# Patient Record
Sex: Male | Born: 1970 | Race: Black or African American | Hispanic: No | Marital: Married | Smoking: Current every day smoker
Health system: Southern US, Community
[De-identification: ages and names within clinical notes are randomized; demographics above are authoritative.]

## PROBLEM LIST (undated history)

## (undated) DIAGNOSIS — J45909 Unspecified asthma, uncomplicated: Secondary | ICD-10-CM

## (undated) DIAGNOSIS — I10 Essential (primary) hypertension: Secondary | ICD-10-CM

## (undated) HISTORY — PX: ACHILLES TENDON REPAIR: SUR1153

---

## 1997-10-19 ENCOUNTER — Emergency Department (HOSPITAL_COMMUNITY): Admission: EM | Admit: 1997-10-19 | Discharge: 1997-10-19 | Payer: Self-pay | Admitting: Emergency Medicine

## 1998-02-26 ENCOUNTER — Emergency Department (HOSPITAL_COMMUNITY): Admission: EM | Admit: 1998-02-26 | Discharge: 1998-02-26 | Payer: Self-pay | Admitting: *Deleted

## 1998-05-09 ENCOUNTER — Emergency Department (HOSPITAL_COMMUNITY): Admission: EM | Admit: 1998-05-09 | Discharge: 1998-05-09 | Payer: Self-pay

## 1998-05-09 ENCOUNTER — Encounter: Payer: Self-pay | Admitting: Emergency Medicine

## 1998-06-06 ENCOUNTER — Encounter: Payer: Self-pay | Admitting: Emergency Medicine

## 1998-06-06 ENCOUNTER — Emergency Department (HOSPITAL_COMMUNITY): Admission: EM | Admit: 1998-06-06 | Discharge: 1998-06-06 | Payer: Self-pay | Admitting: Emergency Medicine

## 1999-03-17 ENCOUNTER — Emergency Department (HOSPITAL_COMMUNITY): Admission: EM | Admit: 1999-03-17 | Discharge: 1999-03-17 | Payer: Self-pay | Admitting: Emergency Medicine

## 1999-03-23 ENCOUNTER — Emergency Department (HOSPITAL_COMMUNITY): Admission: EM | Admit: 1999-03-23 | Discharge: 1999-03-23 | Payer: Self-pay | Admitting: Emergency Medicine

## 1999-08-31 ENCOUNTER — Encounter: Payer: Self-pay | Admitting: Emergency Medicine

## 1999-08-31 ENCOUNTER — Emergency Department (HOSPITAL_COMMUNITY): Admission: EM | Admit: 1999-08-31 | Discharge: 1999-08-31 | Payer: Self-pay | Admitting: Emergency Medicine

## 1999-09-05 ENCOUNTER — Encounter: Payer: Self-pay | Admitting: Emergency Medicine

## 1999-09-05 ENCOUNTER — Emergency Department (HOSPITAL_COMMUNITY): Admission: EM | Admit: 1999-09-05 | Discharge: 1999-09-05 | Payer: Self-pay | Admitting: Emergency Medicine

## 1999-09-25 ENCOUNTER — Encounter: Payer: Self-pay | Admitting: Emergency Medicine

## 1999-09-25 ENCOUNTER — Emergency Department (HOSPITAL_COMMUNITY): Admission: EM | Admit: 1999-09-25 | Discharge: 1999-09-25 | Payer: Self-pay | Admitting: Emergency Medicine

## 2000-03-08 ENCOUNTER — Emergency Department (HOSPITAL_COMMUNITY): Admission: EM | Admit: 2000-03-08 | Discharge: 2000-03-08 | Payer: Self-pay | Admitting: Emergency Medicine

## 2000-04-17 ENCOUNTER — Emergency Department (HOSPITAL_COMMUNITY): Admission: EM | Admit: 2000-04-17 | Discharge: 2000-04-17 | Payer: Self-pay | Admitting: Internal Medicine

## 2000-07-05 ENCOUNTER — Emergency Department (HOSPITAL_COMMUNITY): Admission: EM | Admit: 2000-07-05 | Discharge: 2000-07-05 | Payer: Self-pay | Admitting: Emergency Medicine

## 2000-11-20 ENCOUNTER — Emergency Department (HOSPITAL_COMMUNITY): Admission: EM | Admit: 2000-11-20 | Discharge: 2000-11-21 | Payer: Self-pay | Admitting: Emergency Medicine

## 2000-11-21 ENCOUNTER — Encounter: Payer: Self-pay | Admitting: Emergency Medicine

## 2000-12-22 ENCOUNTER — Emergency Department (HOSPITAL_COMMUNITY): Admission: EM | Admit: 2000-12-22 | Discharge: 2000-12-22 | Payer: Self-pay | Admitting: *Deleted

## 2000-12-22 ENCOUNTER — Encounter: Payer: Self-pay | Admitting: Emergency Medicine

## 2000-12-22 ENCOUNTER — Emergency Department (HOSPITAL_COMMUNITY): Admission: EM | Admit: 2000-12-22 | Discharge: 2000-12-22 | Payer: Self-pay | Admitting: Emergency Medicine

## 2001-02-09 ENCOUNTER — Encounter: Payer: Self-pay | Admitting: Emergency Medicine

## 2001-02-09 ENCOUNTER — Emergency Department (HOSPITAL_COMMUNITY): Admission: EM | Admit: 2001-02-09 | Discharge: 2001-02-09 | Payer: Self-pay | Admitting: Emergency Medicine

## 2001-02-11 ENCOUNTER — Emergency Department (HOSPITAL_COMMUNITY): Admission: EM | Admit: 2001-02-11 | Discharge: 2001-02-11 | Payer: Self-pay | Admitting: Emergency Medicine

## 2001-05-18 ENCOUNTER — Emergency Department (HOSPITAL_COMMUNITY): Admission: EM | Admit: 2001-05-18 | Discharge: 2001-05-18 | Payer: Self-pay

## 2001-12-15 ENCOUNTER — Emergency Department (HOSPITAL_COMMUNITY): Admission: EM | Admit: 2001-12-15 | Discharge: 2001-12-15 | Payer: Self-pay | Admitting: *Deleted

## 2001-12-15 ENCOUNTER — Encounter: Payer: Self-pay | Admitting: Ophthalmology

## 2003-07-16 ENCOUNTER — Emergency Department (HOSPITAL_COMMUNITY): Admission: EM | Admit: 2003-07-16 | Discharge: 2003-07-16 | Payer: Self-pay | Admitting: Emergency Medicine

## 2003-09-18 ENCOUNTER — Emergency Department (HOSPITAL_COMMUNITY): Admission: EM | Admit: 2003-09-18 | Discharge: 2003-09-19 | Payer: Self-pay | Admitting: Emergency Medicine

## 2003-09-21 ENCOUNTER — Encounter (HOSPITAL_COMMUNITY): Admission: RE | Admit: 2003-09-21 | Discharge: 2003-12-20 | Payer: Self-pay | Admitting: Emergency Medicine

## 2003-10-11 ENCOUNTER — Emergency Department (HOSPITAL_COMMUNITY): Admission: EM | Admit: 2003-10-11 | Discharge: 2003-10-11 | Payer: Self-pay | Admitting: Emergency Medicine

## 2003-12-05 ENCOUNTER — Encounter (INDEPENDENT_AMBULATORY_CARE_PROVIDER_SITE_OTHER): Payer: Self-pay | Admitting: *Deleted

## 2003-12-05 ENCOUNTER — Ambulatory Visit (HOSPITAL_BASED_OUTPATIENT_CLINIC_OR_DEPARTMENT_OTHER): Admission: RE | Admit: 2003-12-05 | Discharge: 2003-12-05 | Payer: Self-pay | Admitting: Otolaryngology

## 2003-12-05 ENCOUNTER — Ambulatory Visit (HOSPITAL_COMMUNITY): Admission: RE | Admit: 2003-12-05 | Discharge: 2003-12-05 | Payer: Self-pay | Admitting: Otolaryngology

## 2003-12-23 ENCOUNTER — Emergency Department (HOSPITAL_COMMUNITY): Admission: EM | Admit: 2003-12-23 | Discharge: 2003-12-23 | Payer: Self-pay | Admitting: Emergency Medicine

## 2004-01-02 ENCOUNTER — Emergency Department (HOSPITAL_COMMUNITY): Admission: EM | Admit: 2004-01-02 | Discharge: 2004-01-02 | Payer: Self-pay | Admitting: Emergency Medicine

## 2005-01-05 ENCOUNTER — Emergency Department (HOSPITAL_COMMUNITY): Admission: EM | Admit: 2005-01-05 | Discharge: 2005-01-06 | Payer: Self-pay | Admitting: Emergency Medicine

## 2006-12-29 ENCOUNTER — Emergency Department (HOSPITAL_COMMUNITY): Admission: EM | Admit: 2006-12-29 | Discharge: 2006-12-29 | Payer: Self-pay | Admitting: Emergency Medicine

## 2007-01-21 ENCOUNTER — Emergency Department (HOSPITAL_COMMUNITY): Admission: EM | Admit: 2007-01-21 | Discharge: 2007-01-21 | Payer: Self-pay | Admitting: Emergency Medicine

## 2007-03-12 ENCOUNTER — Emergency Department (HOSPITAL_COMMUNITY): Admission: EM | Admit: 2007-03-12 | Discharge: 2007-03-12 | Payer: Self-pay | Admitting: Emergency Medicine

## 2007-08-02 ENCOUNTER — Emergency Department (HOSPITAL_COMMUNITY): Admission: EM | Admit: 2007-08-02 | Discharge: 2007-08-02 | Payer: Self-pay | Admitting: Emergency Medicine

## 2007-09-25 ENCOUNTER — Emergency Department (HOSPITAL_COMMUNITY): Admission: EM | Admit: 2007-09-25 | Discharge: 2007-09-25 | Payer: Self-pay | Admitting: Emergency Medicine

## 2008-04-15 ENCOUNTER — Emergency Department (HOSPITAL_COMMUNITY): Admission: EM | Admit: 2008-04-15 | Discharge: 2008-04-16 | Payer: Self-pay | Admitting: Emergency Medicine

## 2008-07-16 ENCOUNTER — Emergency Department (HOSPITAL_COMMUNITY): Admission: EM | Admit: 2008-07-16 | Discharge: 2008-07-16 | Payer: Self-pay | Admitting: Emergency Medicine

## 2008-09-04 ENCOUNTER — Emergency Department (HOSPITAL_COMMUNITY): Admission: EM | Admit: 2008-09-04 | Discharge: 2008-09-04 | Payer: Self-pay | Admitting: Emergency Medicine

## 2008-11-02 ENCOUNTER — Emergency Department (HOSPITAL_COMMUNITY): Admission: EM | Admit: 2008-11-02 | Discharge: 2008-11-02 | Payer: Self-pay | Admitting: Emergency Medicine

## 2008-12-08 ENCOUNTER — Emergency Department (HOSPITAL_COMMUNITY): Admission: EM | Admit: 2008-12-08 | Discharge: 2008-12-08 | Payer: Self-pay | Admitting: Emergency Medicine

## 2009-01-24 ENCOUNTER — Emergency Department (HOSPITAL_COMMUNITY): Admission: EM | Admit: 2009-01-24 | Discharge: 2009-01-24 | Payer: Self-pay | Admitting: Emergency Medicine

## 2009-05-09 ENCOUNTER — Emergency Department (HOSPITAL_COMMUNITY): Admission: EM | Admit: 2009-05-09 | Discharge: 2009-05-09 | Payer: Self-pay | Admitting: Emergency Medicine

## 2009-09-22 ENCOUNTER — Emergency Department (HOSPITAL_COMMUNITY): Admission: EM | Admit: 2009-09-22 | Discharge: 2009-09-22 | Payer: Self-pay | Admitting: Emergency Medicine

## 2010-04-20 ENCOUNTER — Emergency Department (HOSPITAL_COMMUNITY)
Admission: EM | Admit: 2010-04-20 | Discharge: 2010-04-21 | Payer: Self-pay | Source: Home / Self Care | Admitting: Emergency Medicine

## 2010-08-18 LAB — COMPREHENSIVE METABOLIC PANEL
ALT: 21 U/L (ref 0–53)
Alkaline Phosphatase: 52 U/L (ref 39–117)
CO2: 22 mEq/L (ref 19–32)
GFR calc non Af Amer: 52 mL/min — ABNORMAL LOW (ref >60)
Glucose, Bld: 128 mg/dL — ABNORMAL HIGH (ref 70–99)
Potassium: 4.1 mEq/L (ref 3.5–5.1)
Sodium: 131 mEq/L — ABNORMAL LOW (ref 135–145)

## 2010-08-18 LAB — URINALYSIS, ROUTINE W REFLEX MICROSCOPIC
Glucose, UA: NEGATIVE mg/dL
Hgb urine dipstick: NEGATIVE
Protein, ur: NEGATIVE mg/dL
Specific Gravity, Urine: 1.003 — ABNORMAL LOW (ref 1.005–1.030)

## 2010-08-18 LAB — GLUCOSE, CAPILLARY: Glucose-Capillary: 131 mg/dL — ABNORMAL HIGH (ref 70–99)

## 2010-08-18 LAB — DIFFERENTIAL
Basophils Relative: 0 % (ref 0–1)
Eosinophils Absolute: 0 10*3/uL (ref 0.0–0.7)
Neutrophils Relative %: 90 % — ABNORMAL HIGH (ref 43–77)

## 2010-08-18 LAB — CBC
Hemoglobin: 14.9 g/dL (ref 13.0–17.0)
RBC: 5.03 MIL/uL (ref 4.22–5.81)

## 2010-09-27 NOTE — Consult Note (Signed)
Angel Martin, [facility]                             ACCOUNT NO.:  [id number]   MEDICAL RECORD NO.:  [id number]                   PATIENT TYPE:  EMS   LOCATION:  ED                                   FACILITY:  [facility]   PHYSICIAN:  Blake Divine., M.D.              DATE OF BIRTH:  July 20, 1970   DATE OF CONSULTATION:  12/15/2001  DATE OF DISCHARGE:  12/15/2001                            OPHTHALMOLOGY CONSULTATION   REASON FOR CONSULTATION:  I was asked to see this patient by Dr. Corlis Leak.  The patient is a 40 year old male who complains of pain and redness  of the left eye; onset approximately two weeks ago.  The patient states he  was working in an area with a lot of dust, and irritation seemed to start  immediately following that, and has gotten progressively worse.  The patient  has tried taking ibuprofen and Motrin, which seems to decrease the pain.  He  has also used Clear-Eye but without effect on the retinas.  The patient  presents today because his vision started to get worse about one week ago,  and has gotten worse and worse to the left eye.  On examination his visual  acuity without correction is 20/40 right eye; 20/200 left eye.  The patient  did not bring his eye glasses.  The patient denies any contact lens wear.  Denies any other trauma except as noted.  The patient's pupils are 3 mm and  reactive.  His confrontation field is full right eye, and is not able to  test left eye secondary to photophobia and pain.  Motility is full.  The  patient's intraocular pressure was 16 right eye, 19 left eye.  The slit lamp  examination reveals plus 2-3 conjunctival injection left eye, conjunctival  quiet right eye, cornea clear right eye; with trace endothelial pigment  right eye.  The cornea left eye revealed endothelial pigment deposit.  There  is a layered 2% white hyphema of the left eye, with plus-3 cell and flare in  the anterior chamber.  The iris reveals posterior synechiae  left eye.  There  are signs of a baseous ring right eye, with pigment on the anterior capsule  of the lens.  The lens is a plus-one nuclear sclerosis.  The fundus revealed  a positive red reflex.  At the time of this dictation his eyes have been  dilated.   IMPRESSION:  1. 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  2. Eye pain.  3. Decreased vision.  All probably secondary to possible collagen vascular disease, which is  sarcoidosis versus Behcet's versus other causes of associated iritis (such  as rheumatoid arthritis).  We will also have to rule out  Louie's.   PLAN:  The patient is being treated in the emergency department with topical  home atropine, Cyclogyl, phenylephrine and TobraDex eye drops.  The patient  will be placed  on TobraDex eye drops to the left eye q.i.d. , and Cyclogyl  q.i.d.  We will also obtain a chest x-ray and a lumbar spine.  Labs will  include ACE, A&A, CBC with differential and platelets, electrolyte panel,  RPR and C-reactive protein and rheumatoid factor.  The patient will follow  up with me tomorrow.                                                Blake Divine., M.D.    RW/MEDQ  D:  12/15/2001  T:  12/20/2001  Job:  [id number]   cc:   [facility]  EMail:  [email or web address]

## 2010-09-27 NOTE — Op Note (Signed)
NAME:  Cancelliere', [facility]                            ACCOUNT NO.:  [id number]   MEDICAL RECORD NO.:  [id number]                   PATIENT TYPE:  AMB   LOCATION:  DSC                                  FACILITY:  [facility]   PHYSICIAN:  Christopher E. Ezzard Standing, M.D.         DATE OF BIRTH:  [birth year]   DATE OF PROCEDURE:  12/05/2003  DATE OF DISCHARGE:                                 OPERATIVE REPORT   PREOPERATIVE DIAGNOSIS:  Left forehead cyst.   POSTOPERATIVE DIAGNOSIS:  Left forehead cyst.   OPERATION PERFORMED:  Excision of left forehead cyst (2 cm).   SURGEON:  Kristine Garbe. Ezzard Standing, M.D.   ANESTHESIA:  Local 1% Xylocaine with 1:100,000 epinephrine.   COMPLICATIONS:  None.   INDICATIONS FOR PROCEDURE:  Angel Martin is a 40 year old gentleman who has had a  cyst above his left eyebrow which had been previously incised and drained  and there is still a firm nodule with a deep scar where the previous cyst  had been incised and drained.  He is taken to the operating room at this  time for wide local excision of remaining cyst and scar tissue.   DESCRIPTION OF PROCEDURE:  The area was prepped with Betadine solution and  was marked out with a Marking pen and injected with Xylocaine with  epinephrine for local anesthetic.  The deep scar and the bulging cyst was  excised down to the subcutaneous muscle layer.  Hemostasis was obtained with  the eye cautery.  The defect was then closed with 4-0 Vicryl sutures  subcutaneously and a 5-0 subcuticular nylon suture along with Steri-Strips.  Leeum tolerated this well.  Will have him follow up in my office in three  days to have the subcuticular stitch removed.  He was instructed to take  Tylenol as needed for pain.                                               Kristine Garbe. Ezzard Standing, M.D.    CEN/MEDQ  D:  12/05/2003  T:  12/05/2003  Job:  [id number]

## 2010-11-16 ENCOUNTER — Emergency Department (HOSPITAL_COMMUNITY)
Admission: EM | Admit: 2010-11-16 | Discharge: 2010-11-16 | Disposition: A | Discharge status: Home / Self Care | Payer: Self-pay | Attending: Emergency Medicine | Admitting: Emergency Medicine

## 2010-11-16 DIAGNOSIS — R0609 Other forms of dyspnea: Secondary | ICD-10-CM | POA: Insufficient documentation

## 2010-11-16 DIAGNOSIS — J45909 Unspecified asthma, uncomplicated: Secondary | ICD-10-CM | POA: Insufficient documentation

## 2010-11-16 DIAGNOSIS — R0989 Other specified symptoms and signs involving the circulatory and respiratory systems: Secondary | ICD-10-CM | POA: Insufficient documentation

## 2010-11-16 DIAGNOSIS — R0602 Shortness of breath: Secondary | ICD-10-CM | POA: Insufficient documentation

## 2011-02-11 ENCOUNTER — Emergency Department (HOSPITAL_COMMUNITY)
Admission: EM | Admit: 2011-02-11 | Discharge: 2011-02-11 | Disposition: A | Discharge status: Home / Self Care | Payer: Self-pay | Attending: Emergency Medicine | Admitting: Emergency Medicine

## 2011-02-11 ENCOUNTER — Emergency Department (HOSPITAL_COMMUNITY): Payer: Self-pay

## 2011-02-11 DIAGNOSIS — M7989 Other specified soft tissue disorders: Secondary | ICD-10-CM | POA: Insufficient documentation

## 2011-02-11 DIAGNOSIS — M79609 Pain in unspecified limb: Secondary | ICD-10-CM | POA: Insufficient documentation

## 2011-09-16 ENCOUNTER — Emergency Department (HOSPITAL_COMMUNITY)
Admission: EM | Admit: 2011-09-16 | Discharge: 2011-09-16 | Disposition: A | Discharge status: Home / Self Care | Payer: Self-pay | Attending: Emergency Medicine | Admitting: Emergency Medicine

## 2011-09-16 ENCOUNTER — Emergency Department (HOSPITAL_COMMUNITY): Payer: Self-pay

## 2011-09-16 ENCOUNTER — Encounter (HOSPITAL_COMMUNITY): Payer: Self-pay | Admitting: *Deleted

## 2011-09-16 DIAGNOSIS — M25579 Pain in unspecified ankle and joints of unspecified foot: Secondary | ICD-10-CM | POA: Insufficient documentation

## 2011-09-16 MED ORDER — NAPROXEN 500 MG PO TABS: 500.0000 mg | ORAL_TABLET | Freq: Two times a day (BID) | ORAL | Status: DC

## 2011-09-16 NOTE — ED Provider Notes (Signed)
History     CSN: [id number]  Arrival date & time 09/16/11  5784   First MD Initiated Contact with Patient 09/16/11 [phone]      Chief Complaint  Patient presents with  . Ankle Pain    (Consider location/radiation/quality/duration/timing/severity/associated sxs/prior treatment) Patient is a 41 y.o. male presenting with ankle pain. The history is provided by the patient.  Ankle Pain  The incident occurred more than 2 days ago. The incident occurred at work. There was no injury mechanism. The pain is present in the right ankle. The pain is moderate. The pain has been constant since onset. Pertinent negatives include no numbness, no inability to bear weight, no muscle weakness, no loss of sensation and no tingling. The symptoms are aggravated by activity. He has tried NSAIDs for the symptoms. The treatment provided no relief.  The pain increases with walking.  Pt does not remember an injury.  NO fevers, cough, trouble breathing.  No calf pain.  History reviewed. No pertinent past medical history.  Past Surgical History  Procedure Date  . Achilles tendon repair     No family history on file.  History  Substance Use Topics  . Smoking status: Current Everyday Smoker  . Smokeless tobacco: Not on file  . Alcohol Use: No      Review of Systems  Neurological: Negative for tingling and numbness.    Allergies  Review of patient's allergies indicates no known allergies.  Home Medications   Current Outpatient Rx  Name Route Sig Dispense Refill  . IBUPROFEN 200 MG PO TABS Oral Take 800 mg by mouth every 6 (six) hours as needed. For pain      BP 142/105  Pulse 66  Temp(Src) 97.4 F (36.3 C) (Oral)  Resp 20  SpO2 100%  Physical Exam  Nursing note and vitals reviewed. Constitutional: He appears well-developed and well-nourished. No distress.  HENT:  Head: Normocephalic and atraumatic.  Right Ear: External ear normal.  Left Ear: External ear normal.  Eyes: Conjunctivae are  normal. Right eye exhibits no discharge. Left eye exhibits no discharge. No scleral icterus.  Neck: Neck supple. No tracheal deviation present.  Cardiovascular: Normal rate.   Pulmonary/Chest: Effort normal. No stridor. No respiratory distress.  Musculoskeletal: He exhibits tenderness. He exhibits no edema.       Right ankle: He exhibits normal range of motion, no swelling, no deformity, no laceration and normal pulse. tenderness. Lateral malleolus and medial malleolus tenderness found. Achilles tendon normal.       Right lower leg: Normal. He exhibits no tenderness, no bony tenderness, no swelling and no edema.  Neurological: He is alert. Cranial nerve deficit: no gross deficits.  Skin: Skin is warm and dry. No rash noted.  Psychiatric: He has a normal mood and affect.    ED Course  Procedures (including critical care time)  Labs Reviewed - No data to display Dg Ankle Complete Right  09/16/2011  *RADIOLOGY REPORT*  Clinical Data: Medial ankle pain and swelling  RIGHT ANKLE - COMPLETE 3+ VIEW  Comparison: Right foot radiographs 02/11/2011  Findings: Ankle mortise intact. Spurring at medial malleolus. Small unfused ossicle lateral malleolus unchanged. No acute fracture, dislocation, or bone destruction. Bones appear mildly demineralized. No definite regional soft tissue abnormalities.  IMPRESSION: No definite acute bony abnormalities.  Original Report Authenticated By: Lollie Marrow, M.D.     1. Ankle pain       MDM  Doubt DVT.  No sign of infection.  ?tendonitis.  Will dc home on po pain meds, ortho referral        Celene Kras, MD 09/16/11 [phone]

## 2011-09-16 NOTE — ED Notes (Signed)
Ortho tech at bedside 

## 2011-09-16 NOTE — Discharge Instructions (Signed)
Arthralgia Your caregiver has diagnosed you as suffering from an arthralgia. Arthralgia means there is pain in a joint. This can come from many reasons including:  Bruising the joint which causes soreness (inflammation) in the joint.   Wear and tear on the joints which occur as we grow older (osteoarthritis).   Overusing the joint.   Various forms of arthritis.   Infections of the joint.  Regardless of the cause of pain in your joint, most of these different pains respond to anti-inflammatory drugs and rest. The exception to this is when a joint is infected, and these cases are treated with antibiotics, if it is a bacterial infection. HOME CARE INSTRUCTIONS   Rest the injured area for as long as directed by your caregiver. Then slowly start using the joint as directed by your caregiver and as the pain allows. Crutches as directed may be useful if the ankles, knees or hips are involved. If the knee was splinted or casted, continue use and care as directed. If an stretchy or elastic wrapping bandage has been applied today, it should be removed and re-applied every 3 to 4 hours. It should not be applied tightly, but firmly enough to keep swelling down. Watch toes and feet for swelling, bluish discoloration, coldness, numbness or excessive pain. If any of these problems (symptoms) occur, remove the ace bandage and re-apply more loosely. If these symptoms persist, contact your caregiver or return to this location.   For the first 24 hours, keep the injured extremity elevated on pillows while lying down.   Apply ice for 15 to 20 minutes to the sore joint every couple hours while awake for the first half day. Then 3 to 4 times per day for the first 48 hours. Put the ice in a plastic bag and place a towel between the bag of ice and your skin.   Wear any splinting, casting, elastic bandage applications, or slings as instructed.   Only take over-the-counter or prescription medicines for pain,  discomfort, or fever as directed by your caregiver. Do not use aspirin immediately after the injury unless instructed by your physician. Aspirin can cause increased bleeding and bruising of the tissues.   If you were given crutches, continue to use them as instructed and do not resume weight bearing on the sore joint until instructed.  Persistent pain and inability to use the sore joint as directed for more than 2 to 3 days are warning signs indicating that you should see a caregiver for a follow-up visit as soon as possible. Initially, a hairline fracture (break in bone) may not be evident on X-rays. Persistent pain and swelling indicate that further evaluation, non-weight bearing or use of the joint (use of crutches or slings as instructed), or further X-rays are indicated. X-rays may sometimes not show a small fracture until a week or 10 days later. Make a follow-up appointment with your own caregiver or one to whom we have referred you. A radiologist (specialist in reading X-rays) may read your X-rays. Make sure you know how you are to obtain your X-ray results. Do not assume everything is normal if you do not hear from us. SEEK MEDICAL CARE IF: Bruising, swelling, or pain increases. SEEK IMMEDIATE MEDICAL CARE IF:   Your fingers or toes are numb or blue.   The pain is not responding to medications and continues to stay the same or get worse.   The pain in your joint becomes severe.   You develop a fever over   102 F (38.9 C).   It becomes impossible to move or use the joint.  MAKE SURE YOU:   Understand these instructions.   Will watch your condition.   Will get help right away if you are not doing well or get worse.  Document Released: 04/28/2005 Document Revised: 04/17/2011 Document Reviewed: 12/15/2007 [employer] Patient Information 2012 [employer]. 

## 2011-09-16 NOTE — Progress Notes (Signed)
Orthopedic Tech Progress Note Patient Details:  Angel Martin 1970/08/26 [id number]  Other Ortho Devices Type of Ortho Device: ASO;Crutches Ortho Device Location: right foot Ortho Device Interventions: Application   Fitzroy Mikami T 09/16/2011, 10:08 AM

## 2011-09-16 NOTE — ED Notes (Signed)
Patient reports he noted onset of pain in his right ankle on wed.  He denies any trauma.  Patient states his ankle has become more sore to the point that he cannot wear his boots for work.  He denies recent travel.  There is no noted redness/warmth/swelling to the foot and ankle. Patient does report the pain radiates up into the back of his lower leg

## 2011-09-20 ENCOUNTER — Encounter (HOSPITAL_COMMUNITY): Payer: Self-pay

## 2011-09-20 ENCOUNTER — Emergency Department (HOSPITAL_COMMUNITY)
Admission: EM | Admit: 2011-09-20 | Discharge: 2011-09-21 | Disposition: A | Discharge status: Home / Self Care | Payer: Self-pay | Attending: Emergency Medicine | Admitting: Emergency Medicine

## 2011-09-20 ENCOUNTER — Emergency Department (HOSPITAL_COMMUNITY): Payer: Self-pay

## 2011-09-20 DIAGNOSIS — R0682 Tachypnea, not elsewhere classified: Secondary | ICD-10-CM | POA: Insufficient documentation

## 2011-09-20 DIAGNOSIS — R059 Cough, unspecified: Secondary | ICD-10-CM | POA: Insufficient documentation

## 2011-09-20 DIAGNOSIS — R0989 Other specified symptoms and signs involving the circulatory and respiratory systems: Secondary | ICD-10-CM | POA: Insufficient documentation

## 2011-09-20 DIAGNOSIS — J45909 Unspecified asthma, uncomplicated: Secondary | ICD-10-CM | POA: Insufficient documentation

## 2011-09-20 DIAGNOSIS — R000 Tachycardia, unspecified: Secondary | ICD-10-CM | POA: Insufficient documentation

## 2011-09-20 DIAGNOSIS — F411 Generalized anxiety disorder: Secondary | ICD-10-CM | POA: Insufficient documentation

## 2011-09-20 DIAGNOSIS — R0609 Other forms of dyspnea: Secondary | ICD-10-CM | POA: Insufficient documentation

## 2011-09-20 DIAGNOSIS — R61 Generalized hyperhidrosis: Secondary | ICD-10-CM | POA: Insufficient documentation

## 2011-09-20 DIAGNOSIS — R05 Cough: Secondary | ICD-10-CM | POA: Insufficient documentation

## 2011-09-20 LAB — BLOOD GAS, VENOUS
Bicarbonate: 24.6 mEq/L — ABNORMAL HIGH (ref 20.0–24.0)
O2 Content: 4 L/min
Patient temperature: 98.6
pH, Ven: 7.424 — ABNORMAL HIGH (ref 7.250–7.300)

## 2011-09-20 MED ORDER — MAGNESIUM SULFATE 50 % IJ SOLN: 2.0000 g | Freq: Once | INTRAMUSCULAR | Status: DC

## 2011-09-20 MED ORDER — ALBUTEROL SULFATE (5 MG/ML) 0.5% IN NEBU
2.5000 mg | INHALATION_SOLUTION | Freq: Once | RESPIRATORY_TRACT | Status: AC
  Administered 2011-09-20: 2.5 mg via RESPIRATORY_TRACT

## 2011-09-20 MED ORDER — METHYLPREDNISOLONE SODIUM SUCC 125 MG IJ SOLR
125.0000 mg | Freq: Once | INTRAMUSCULAR | Status: AC
  Administered 2011-09-21: 125 mg via INTRAVENOUS
  Filled 2011-09-20: qty 2

## 2011-09-20 MED ORDER — MAGNESIUM SULFATE 40 MG/ML IJ SOLN
2.0000 g | Freq: Once | INTRAMUSCULAR | Status: AC
  Administered 2011-09-21: 2 g via INTRAVENOUS
  Filled 2011-09-20: qty 50

## 2011-09-20 MED ORDER — SODIUM CHLORIDE 0.9 % IV SOLN
Freq: Once | INTRAVENOUS | Status: AC
  Administered 2011-09-21: 20 mL/h via INTRAVENOUS

## 2011-09-20 MED ORDER — ALBUTEROL SULFATE (5 MG/ML) 0.5% IN NEBU
10.0000 mg | INHALATION_SOLUTION | Freq: Once | RESPIRATORY_TRACT | Status: AC
  Administered 2011-09-20: 10 mg via RESPIRATORY_TRACT

## 2011-09-20 MED ORDER — IPRATROPIUM BROMIDE 0.02 % IN SOLN
0.5000 mg | Freq: Once | RESPIRATORY_TRACT | Status: AC
  Administered 2011-09-20: 0.5 mg via RESPIRATORY_TRACT

## 2011-09-20 NOTE — ED Notes (Signed)
Pt in ACUTE DISTRESS.  Diaphoretic unable to speakMaurine Minister RN triaged this pt.  EDP [place] updated on pt current status- verbal orders given and started RRT called for evaluation and treatment

## 2011-09-20 NOTE — ED Provider Notes (Signed)
History     CSN: [id number]  Arrival date & time 09/20/11  2330   First MD Initiated Contact with Patient 09/20/11 2336      Chief Complaint  Patient presents with  . Asthma    (Consider location/radiation/quality/duration/timing/severity/associated sxs/prior treatment) HPI Level 5 Caveat: dyspnea. This is a 41 year old white male with a history of asthma. Difficulty breathing for about the last 24 hours. He became acutely severe about 45 minutes prior to arrival. Severity was complicated by the fact that his albuterol inhaler is empty. He was given an albuterol and Atrovent neb treatment by the triage nurse per protocol on arrival. There was no significant improvement with this. His symptoms are described as severe. He is having difficulty speaking in full senses. He has been coughing.  History reviewed. No pertinent past medical history.  Past Surgical History  Procedure Date  . Achilles tendon repair     History reviewed. No pertinent family history.  History  Substance Use Topics  . Smoking status: Current Everyday Smoker  . Smokeless tobacco: Not on file  . Alcohol Use: No      Review of Systems  Unable to perform ROS   Allergies  Review of patient's allergies indicates no known allergies.  Home Medications   Current Outpatient Rx  Name Route Sig Dispense Refill  . NAPROXEN 500 MG PO TABS Oral Take 1 tablet (500 mg total) by mouth 2 (two) times daily with a meal. As needed for pain 20 tablet 0    BP 145/95  Pulse 106  Temp(Src) 98.8 F (37.1 C) (Oral)  Resp 24  SpO2 96%  Physical Exam General: Well-developed, well-nourished male in  distress; appearance consistent with age of record HENT: normocephalic, atraumatic Eyes: pupils equal round and reactive to light; extraocular muscles intact Neck: supple Heart: regular rate and rhythm; tachycardia Lungs: Wheezing on inspiration and expiration with decreased air movement; tachypnea Abdomen: soft;  nondistended Extremities: No deformity; full range of motion; pulses normal Neurologic: Awake, alert; motor function intact in all extremities and symmetric; no facial droop Skin: Diaphoretic Psychiatric: Anxious    ED Course  Procedures (including critical care time)     MDM   Nursing notes and vitals signs, including pulse oximetry, reviewed.  Summary of this visit's results, reviewed by myself:  Labs:  Results for orders placed during the hospital encounter of 09/20/11  CBC      Component Value Range   WBC 13.7 (*) 4.0 - 10.5 (K/uL)   RBC 4.98  4.22 - 5.81 (MIL/uL)   Hemoglobin 14.7  13.0 - 17.0 (g/dL)   HCT 62.1  30.8 - 65.7 (%)   MCV 87.3  78.0 - 100.0 (fL)   MCH 29.5  26.0 - 34.0 (pg)   MCHC 33.8  30.0 - 36.0 (g/dL)   RDW 84.6  96.2 - 95.2 (%)   Platelets 192  150 - 400 (K/uL)  DIFFERENTIAL      Component Value Range   Neutrophils Relative 70  43 - 77 (%)   Neutro Abs 9.6 (*) 1.7 - 7.7 (K/uL)   Lymphocytes Relative 20  12 - 46 (%)   Lymphs Abs 2.7  0.7 - 4.0 (K/uL)   Monocytes Relative 7  3 - 12 (%)   Monocytes Absolute 0.9  0.1 - 1.0 (K/uL)   Eosinophils Relative 3  0 - 5 (%)   Eosinophils Absolute 0.4  0.0 - 0.7 (K/uL)   Basophils Relative 0  0 - 1 (%)  Basophils Absolute 0.1  0.0 - 0.1 (K/uL)  BASIC METABOLIC PANEL      Component Value Range   Sodium 140  135 - 145 (mEq/L)   Potassium 3.6  3.5 - 5.1 (mEq/L)   Chloride 105  96 - 112 (mEq/L)   CO2 23  19 - 32 (mEq/L)   Glucose, Bld 92  70 - 99 (mg/dL)   BUN 15  6 - 23 (mg/dL)   Creatinine, Ser 5.78  0.50 - 1.35 (mg/dL)   Calcium 8.9  8.4 - 46.9 (mg/dL)   GFR calc non Af Amer 88 (*) >90 (mL/min)   GFR calc Af Amer >90  >90 (mL/min)  BLOOD GAS, VENOUS      Component Value Range   O2 Content 4.0     Delivery systems NASAL CANNULA     pH, Ven 7.424 (*) 7.250 - 7.300    pCO2, Ven 38.3 (*) 45.0 - 50.0 (mmHg)   pO2, Ven 36.2  30.0 - 45.0 (mmHg)   Bicarbonate 24.6 (*) 20.0 - 24.0 (mEq/L)   TCO2 21.5  0  - 100 (mmol/L)   Acid-Base Excess 0.9  0.0 - 2.0 (mmol/L)   O2 Saturation 68.8     Patient temperature 98.6     Collection site VEIN     Drawn by COLLECTED BY NURSE     Sample type VENOUS      Imaging Studies: Dg Chest Port 1 View  09/21/2011  *RADIOLOGY REPORT*  Clinical Data: Shortness of breath and difficulty breathing.  PORTABLE CHEST - 1 VIEW  Comparison: 04/20/2010  Findings: Shallow inspiration.  Mild cardiac enlargement and pulmonary vascular prominence.  No evidence of edema or consolidation.  No blunting of costophrenic angles.  No pneumothorax.  IMPRESSION: Mild cardiac enlargement and pulmonary vascular congestion without edema.  No focal consolidation.  Original Report Authenticated By: Marlon Pel, M.D.    12:18 AM Dyspnea has improved on continuous albuterol neb. Patient given Solu-Medrol 125 mg IV and magnesium sulfate 2 g IV.  2:09 AM Dyspnea is continued to improve the patient still wheezing with decreased air movement. We'll administer a second continuous neb treatment.  5:57 AM Patient is resting comfortably. Wheezing is resolved. We will have him ambulate to determine his ability to tolerate exertion.  6:41AM Patient tolerated ambulation well. He will return should he worsen. Will send him home on albuterol inhaler and a steroid course.         Hanley Seamen, MD 09/21/11 [phone]

## 2011-09-21 LAB — CBC
MCH: 29.5 pg (ref 26.0–34.0)
Platelets: 192 10*3/uL (ref 150–400)
RBC: 4.98 MIL/uL (ref 4.22–5.81)
WBC: 13.7 10*3/uL — ABNORMAL HIGH (ref 4.0–10.5)

## 2011-09-21 LAB — BASIC METABOLIC PANEL
GFR calc Af Amer: >90 mL/min (ref >90)
GFR calc non Af Amer: 88 mL/min — ABNORMAL LOW (ref >90)
Glucose, Bld: 92 mg/dL (ref 70–99)
Potassium: 3.6 mEq/L (ref 3.5–5.1)
Sodium: 140 mEq/L (ref 135–145)

## 2011-09-21 LAB — DIFFERENTIAL
Basophils Absolute: 0.1 10*3/uL (ref 0.0–0.1)
Eosinophils Absolute: 0.4 10*3/uL (ref 0.0–0.7)
Lymphocytes Relative: 20 % (ref 12–46)
Lymphs Abs: 2.7 10*3/uL (ref 0.7–4.0)
Neutrophils Relative %: 70 % (ref 43–77)

## 2011-09-21 MED ORDER — ALBUTEROL SULFATE (5 MG/ML) 0.5% IN NEBU
10.0000 mg | INHALATION_SOLUTION | Freq: Once | RESPIRATORY_TRACT | Status: AC
  Administered 2011-09-21: 10 mg via RESPIRATORY_TRACT

## 2011-09-21 MED ORDER — PREDNISONE 10 MG PO TABS: ORAL_TABLET | ORAL | Status: DC

## 2011-09-21 MED ORDER — ALBUTEROL SULFATE HFA 108 (90 BASE) MCG/ACT IN AERS
2.0000 | INHALATION_SPRAY | RESPIRATORY_TRACT | Status: DC | PRN
  Administered 2011-09-21: 2 via RESPIRATORY_TRACT

## 2011-09-21 NOTE — Discharge Instructions (Signed)
Asthma Attack Prevention HOW CAN ASTHMA BE PREVENTED? Currently, there is no way to prevent asthma from starting. However, you can take steps to control the disease and prevent its symptoms after you have been diagnosed. Learn about your asthma and how to control it. Take an active role to control your asthma by working with your caregiver to create and follow an asthma action plan. An asthma action plan guides you in taking your medicines properly, avoiding factors that make your asthma worse, tracking your level of asthma control, responding to worsening asthma, and seeking emergency care when needed. To track your asthma, keep records of your symptoms, check your peak flow number using a peak flow meter (handheld device that shows how well air moves out of your lungs), and get regular asthma checkups.  Other ways to prevent asthma attacks include:  Use medicines as your caregiver directs.   Identify and avoid things that make your asthma worse (as much as you can).   Keep track of your asthma symptoms and level of control.   Get regular checkups for your asthma.   With your caregiver, write a detailed plan for taking medicines and managing an asthma attack. Then be sure to follow your action plan. Asthma is an ongoing condition that needs regular monitoring and treatment.   Identify and avoid asthma triggers. A number of outdoor allergens and irritants (pollen, mold, cold air, air pollution) can trigger asthma attacks. Find out what causes or makes your asthma worse, and take steps to avoid those triggers (see below).   Monitor your breathing. Learn to recognize warning signs of an attack, such as slight coughing, wheezing or shortness of breath. However, your lung function may already decrease before you notice any signs or symptoms, so regularly measure and record your peak airflow with a home peak flow meter.   Identify and treat attacks early. If you act quickly, you're less likely to have  a severe attack. You will also need less medicine to control your symptoms. When your peak flow measurements decrease and alert you to an upcoming attack, take your medicine as instructed, and immediately stop any activity that may have triggered the attack. If your symptoms do not improve, get medical help.   Pay attention to increasing quick-relief inhaler use. If you find yourself relying on your quick-relief inhaler (such as albuterol), your asthma is not under control. See your caregiver about adjusting your treatment.  IDENTIFY AND CONTROL FACTORS THAT MAKE YOUR ASTHMA WORSE A number of common things can set off or make your asthma symptoms worse (asthma triggers). Keep track of your asthma symptoms for several weeks, detailing all the environmental and emotional factors that are linked with your asthma. When you have an asthma attack, go back to your asthma diary to see which factor, or combination of factors, might have contributed to it. Once you know what these factors are, you can take steps to control many of them.  Allergies: If you have allergies and asthma, it is important to take asthma prevention steps at home. Asthma attacks (worsening of asthma symptoms) can be triggered by allergies, which can cause temporary increased inflammation of your airways. Minimizing contact with the substance to which you are allergic will help prevent an asthma attack. Animal Dander:   Some people are allergic to the flakes of skin or dried saliva from animals with fur or feathers. Keep these pets out of your home.   If you can't keep a pet outdoors, keep the   pet out of your bedroom and other sleeping areas at all times, and keep the door closed.   Remove carpets and furniture covered with cloth from your home. If that is not possible, keep the pet away from fabric-covered furniture and carpets.  Dust Mites:  Many people with asthma are allergic to dust mites. Dust mites are tiny bugs that are found in  every home, in mattresses, pillows, carpets, fabric-covered furniture, bedcovers, clothes, stuffed toys, fabric, and other fabric-covered items.   Cover your mattress in a special dust-proof cover.   Cover your pillow in a special dust-proof cover, or wash the pillow each week in hot water. Water must be hotter than 130 F to kill dust mites. Cold or warm water used with detergent and bleach can also be effective.   Wash the sheets and blankets on your bed each week in hot water.   Try not to sleep or lie on cloth-covered cushions.   Call ahead when traveling and ask for a smoke-free hotel room. Bring your own bedding and pillows, in case the hotel only supplies feather pillows and down comforters, which may contain dust mites and cause asthma symptoms.   Remove carpets from your bedroom and those laid on concrete, if you can.   Keep stuffed toys out of the bed, or wash the toys weekly in hot water or cooler water with detergent and bleach.  Cockroaches:  Many people with asthma are allergic to the droppings and remains of cockroaches.   Keep food and garbage in closed containers. Never leave food out.   Use poison baits, traps, powders, gels, or paste (for example, boric acid).   If a spray is used to kill cockroaches, stay out of the room until the odor goes away.  Indoor Mold:  Fix leaky faucets, pipes, or other sources of water that have mold around them.   Clean moldy surfaces with a cleaner that has bleach in it.  Pollen and Outdoor Mold:  When pollen or mold spore counts are high, try to keep your windows closed.   Stay indoors with windows closed from late morning to afternoon, if you can. Pollen and some mold spore counts are highest at that time.   Ask your caregiver whether you need to take or increase anti-inflammatory medicine before your allergy season starts.  Irritants:   Tobacco smoke is an irritant. If you smoke, ask your caregiver how you can quit. Ask family  members to quit smoking, too. Do not allow smoking in your home or car.   If possible, do not use a wood-burning stove, kerosene heater, or fireplace. Minimize exposure to all sources of smoke, including incense, candles, fires, and fireworks.   Try to stay away from strong odors and sprays, such as perfume, talcum powder, hair spray, and paints.   Decrease humidity in your home and use an indoor air cleaning device. Reduce indoor humidity to below 60 percent. Dehumidifiers or central air conditioners can do this.   Try to have someone else vacuum for you once or twice a week, if you can. Stay out of rooms while they are being vacuumed and for a short while afterward.   If you vacuum, use a dust mask from a hardware store, a double-layered or microfilter vacuum cleaner bag, or a vacuum cleaner with a HEPA filter.   Sulfites in foods and beverages can be irritants. Do not drink beer or wine, or eat dried fruit, processed potatoes, or shrimp if they cause asthma   symptoms.   Cold air can trigger an asthma attack. Cover your nose and mouth with a scarf on cold or windy days.   Several health conditions can make asthma more difficult to manage, including runny nose, sinus infections, reflux disease, psychological stress, and sleep apnea. Your caregiver will treat these conditions, as well.   Avoid close contact with people who have a cold or the flu, since your asthma symptoms may get worse if you catch the infection from them. Wash your hands thoroughly after touching items that may have been handled by people with a respiratory infection.   Get a flu shot every year to protect against the flu virus, which often makes asthma worse for days or weeks. Also get a pneumonia shot once every five to 10 years.  Drugs:  Aspirin and other painkillers can cause asthma attacks. 10% to 20% of people with asthma have sensitivity to aspirin or a group of painkillers called non-steroidal anti-inflammatory drugs  (NSAIDS), such as ibuprofen and naproxen. These drugs are used to treat pain and reduce fevers. Asthma attacks caused by any of these medicines can be severe and even fatal. These drugs must be avoided in people who have known aspirin sensitive asthma. Products with acetaminophen are considered safe for people who have asthma. It is important that people with aspirin sensitivity read labels of all over-the-counter drugs used to treat pain, colds, coughs, and fever.   Beta blockers and ACE inhibitors are other drugs which you should discuss with your caregiver, in relation to your asthma.  ALLERGY SKIN TESTING  Ask your asthma caregiver about allergy skin testing or blood testing (RAST test) to identify the allergens to which you are sensitive. If you are found to have allergies, allergy shots (immunotherapy) for asthma may help prevent future allergies and asthma. With allergy shots, small doses of allergens (substances to which you are allergic) are injected under your skin on a regular schedule. Over a period of time, your body may become used to the allergen and less responsive with asthma symptoms. You can also take measures to minimize your exposure to those allergens. EXERCISE  If you have exercise-induced asthma, or are planning vigorous exercise, or exercise in cold, humid, or dry environments, prevent exercise-induced asthma by following your caregiver's advice regarding asthma treatment before exercising. Document Released: 04/16/2009 Document Revised: 04/17/2011 Document Reviewed: 04/16/2009 [employer] Patient Information 2012 [employer]. 

## 2011-09-21 NOTE — ED Notes (Signed)
Pt ambulate 48ft down the hall. Pulse oximetry of 97% while ambulating. Pt states " I feel a lot better but still slightly weak. I think I am ready to go home."

## 2012-01-09 ENCOUNTER — Encounter (HOSPITAL_COMMUNITY): Payer: Self-pay | Admitting: Family Medicine

## 2012-01-09 ENCOUNTER — Emergency Department (HOSPITAL_COMMUNITY)
Admission: EM | Admit: 2012-01-09 | Discharge: 2012-01-09 | Disposition: A | Discharge status: Home / Self Care | Payer: Self-pay | Attending: Emergency Medicine | Admitting: Emergency Medicine

## 2012-01-09 DIAGNOSIS — F17200 Nicotine dependence, unspecified, uncomplicated: Secondary | ICD-10-CM | POA: Insufficient documentation

## 2012-01-09 DIAGNOSIS — J45901 Unspecified asthma with (acute) exacerbation: Secondary | ICD-10-CM | POA: Insufficient documentation

## 2012-01-09 HISTORY — DX: Unspecified asthma, uncomplicated: J45.909

## 2012-01-09 MED ORDER — ALBUTEROL SULFATE HFA 108 (90 BASE) MCG/ACT IN AERS: 1.0000 | INHALATION_SPRAY | Freq: Four times a day (QID) | RESPIRATORY_TRACT | Status: DC | PRN

## 2012-01-09 MED ORDER — ALBUTEROL SULFATE (5 MG/ML) 0.5% IN NEBU
5.0000 mg | INHALATION_SOLUTION | Freq: Once | RESPIRATORY_TRACT | Status: AC
  Administered 2012-01-09: 5 mg via RESPIRATORY_TRACT

## 2012-01-09 MED ORDER — PREDNISONE 20 MG PO TABS: 60.0000 mg | ORAL_TABLET | Freq: Every day | ORAL | Status: DC

## 2012-01-09 MED ORDER — ALBUTEROL SULFATE HFA 108 (90 BASE) MCG/ACT IN AERS
2.0000 | INHALATION_SPRAY | RESPIRATORY_TRACT | Status: DC | PRN
  Administered 2012-01-09: 2 via RESPIRATORY_TRACT
  Filled 2012-01-09: qty 6.7

## 2012-01-09 NOTE — ED Notes (Signed)
Pt had breathing treatment and feels much better. Breathing is no longer labored. Lungs clear

## 2012-01-09 NOTE — ED Provider Notes (Signed)
Medical screening examination/treatment/procedure(s) were performed by non-physician practitioner and as supervising physician I was immediately available for consultation/collaboration. Manisha Cancel, MD, FACEP   Azha Constantin L Jontue Crumpacker, MD 01/09/12 2016 

## 2012-01-09 NOTE — ED Provider Notes (Signed)
History     CSN: [id number]  Arrival date & time 01/09/12  1007   First MD Initiated Contact with Patient 01/09/12 1106      Chief Complaint  Patient presents with  . Shortness of Breath    (Consider location/radiation/quality/duration/timing/severity/associated sxs/prior treatment) HPI  Patient to the ER with complaints of wheezing and being out of his albuterol inhaler. He has ongoing asthma for which he takes Advair and albuterol daily. He says that on Monday he ran out of his inhaler and since then his wheezing has been progressively getting worse. He denies difficulty breathing or shortness of breath. He has come to the ER for refills of his inhaler. Pt received breathing treatments in triage. He denies coughing, fevers,weakness, chills or any other associated symptoms. The patient speaks in full sentences and is not in respiratory distress.   Past Medical History  Diagnosis Date  . Asthma     Past Surgical History  Procedure Date  . Achilles tendon repair     History reviewed. No pertinent family history.  History  Substance Use Topics  . Smoking status: Current Everyday Smoker  . Smokeless tobacco: Not on file  . Alcohol Use: No      Review of Systems   HEENT: denies blurry vision or change in hearing PULMONARY: Denies difficulty breathing and SOB, + wheezing CARDIAC: denies chest pain or heart palpitations MUSCULOSKELETAL:  denies being unable to ambulate ABDOMEN AL: denies abdominal pain GU: denies loss of bowel or urinary control NEURO: denies numbness and tingling in extremities SKIN: no new rashes PSYCH: patient denies anxiety or depression. NECK: Pt denies having neck pain     Allergies  Review of patient's allergies indicates no known allergies.  Home Medications   Current Outpatient Rx  Name Route Sig Dispense Refill  . ALBUTEROL SULFATE HFA 108 (90 BASE) MCG/ACT IN AERS Inhalation Inhale 2 puffs into the lungs every 6 (six) hours as  needed. For shortness of breath    . FLUTICASONE-SALMETEROL 500-50 MCG/DOSE IN AEPB Inhalation Inhale 1 puff into the lungs every 12 (twelve) hours.    Marland Kitchen NAPROXEN 500 MG PO TABS Oral Take 1 tablet (500 mg total) by mouth 2 (two) times daily with a meal. As needed for pain 20 tablet 0  . ALBUTEROL SULFATE HFA 108 (90 BASE) MCG/ACT IN AERS Inhalation Inhale 1-2 puffs into the lungs every 6 (six) hours as needed for wheezing. 1 Inhaler 3  . PREDNISONE 20 MG PO TABS Oral Take 3 tablets (60 mg total) by mouth daily. 15 tablet 0    BP 146/95  Pulse 86  Temp 98.3 F (36.8 C) (Oral)  Resp 24  SpO2 98%  Physical Exam  Nursing note and vitals reviewed. Constitutional: He appears well-developed and well-nourished. No distress.  HENT:  Head: Normocephalic and atraumatic.  Eyes: Pupils are equal, round, and reactive to light.  Neck: Normal range of motion. Neck supple.  Cardiovascular: Normal rate and regular rhythm.   Pulmonary/Chest: Effort normal. No respiratory distress. He has wheezes (expiratory wheezin). He has no rales. He exhibits no tenderness.  Abdominal: Soft.  Neurological: He is alert.  Skin: Skin is warm and dry.    ED Course  Procedures (including critical care time)  Labs Reviewed - No data to display No results found.   1. Asthma exacerbation       MDM  Pt given breathing treatment in the ER. Wheezing has much resolved, the patient states that he feels so much  better. Will give albuterol inhaler in ED as well as an Rx with refills. Will also give short course of steroids.   Pt has been advised of the symptoms that warrant their return to the ED. Patient has voiced understanding and has agreed to follow-up with the PCP or specialist.         Dorthula Matas, PA 01/09/12 1200

## 2012-01-09 NOTE — ED Notes (Signed)
Per pt he has been having asthma attacks all week. sts ran out of his albuterol inhaler Tuesday. Pt very SOB talking in short sentences. Auditory wheezing

## 2012-01-09 NOTE — ED Notes (Signed)
Patient presents to ed c/o sob states he feels much better after breathing treatment.

## 2012-02-11 ENCOUNTER — Emergency Department (HOSPITAL_COMMUNITY): Payer: Self-pay

## 2012-02-11 ENCOUNTER — Emergency Department (HOSPITAL_COMMUNITY)
Admission: EM | Admit: 2012-02-11 | Discharge: 2012-02-11 | Disposition: A | Discharge status: Home / Self Care | Payer: Self-pay | Attending: Emergency Medicine | Admitting: Emergency Medicine

## 2012-02-11 ENCOUNTER — Encounter (HOSPITAL_COMMUNITY): Payer: Self-pay | Admitting: Vascular Surgery

## 2012-02-11 DIAGNOSIS — S8990XA Unspecified injury of unspecified lower leg, initial encounter: Secondary | ICD-10-CM | POA: Insufficient documentation

## 2012-02-11 DIAGNOSIS — M7989 Other specified soft tissue disorders: Secondary | ICD-10-CM | POA: Insufficient documentation

## 2012-02-11 DIAGNOSIS — M25579 Pain in unspecified ankle and joints of unspecified foot: Secondary | ICD-10-CM | POA: Insufficient documentation

## 2012-02-11 MED ORDER — IBUPROFEN 800 MG PO TABS
800.0000 mg | ORAL_TABLET | Freq: Once | ORAL | Status: AC
  Administered 2012-02-11: 800 mg via ORAL
  Filled 2012-02-11: qty 1
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
## 2012-02-11 NOTE — ED Notes (Signed)
Called and spoke with Ortho.

## 2012-02-11 NOTE — Progress Notes (Signed)
Orthopedic Tech Progress Note Patient Details:  Angel Martin October 20, 1970 [id number]  Ortho Devices Type of Ortho Device: Crutches;ASO Ortho Device/Splint Interventions: Application   Cammer, Mickie Bail 02/11/2012, 11:34 AM
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
## 2012-02-11 NOTE — ED Notes (Signed)
Patient given food

## 2012-02-11 NOTE — ED Notes (Signed)
MD at bedside. 

## 2012-02-15 ENCOUNTER — Encounter (HOSPITAL_COMMUNITY): Payer: Self-pay | Admitting: Emergency Medicine

## 2012-02-15 ENCOUNTER — Emergency Department (HOSPITAL_COMMUNITY)
Admission: EM | Admit: 2012-02-15 | Discharge: 2012-02-15 | Disposition: A | Discharge status: Home / Self Care | Payer: Self-pay | Attending: Emergency Medicine | Admitting: Emergency Medicine

## 2012-02-15 DIAGNOSIS — L03119 Cellulitis of unspecified part of limb: Secondary | ICD-10-CM | POA: Insufficient documentation

## 2012-02-15 DIAGNOSIS — L0390 Cellulitis, unspecified: Secondary | ICD-10-CM

## 2012-02-15 DIAGNOSIS — M79669 Pain in unspecified lower leg: Secondary | ICD-10-CM

## 2012-02-15 DIAGNOSIS — J45909 Unspecified asthma, uncomplicated: Secondary | ICD-10-CM | POA: Insufficient documentation

## 2012-02-15 DIAGNOSIS — L02419 Cutaneous abscess of limb, unspecified: Secondary | ICD-10-CM | POA: Insufficient documentation

## 2012-02-15 DIAGNOSIS — F17200 Nicotine dependence, unspecified, uncomplicated: Secondary | ICD-10-CM | POA: Insufficient documentation

## 2012-02-15 MED ORDER — CEPHALEXIN 500 MG PO CAPS: 500.0000 mg | ORAL_CAPSULE | Freq: Four times a day (QID) | ORAL | Status: DC

## 2012-02-15 NOTE — Discharge Instructions (Signed)
You were seen and evaluated for your symptoms of continued pain and swelling of the right lower leg. At this time your providers have some slight concern for possible skin infection. You've been given a prescription for an antibiotic to take to prevent infection. Please take this as prescribed for the full length of time. It is also recommended that you use compression, rest and elevation to reduce pain and swelling in your foot. Your given an Ace wrap to wrap your leg and foot to reduce swelling. Please use this as instructed. Please have a recheck of your symptoms in 2-3 days to be sure they are improving. Return sooner if you have any worsening symptoms.    Cellulitis Cellulitis is an infection of the skin and the tissue beneath it. The infected area is usually red and tender. Cellulitis occurs most often in the arms and lower legs.  CAUSES  Cellulitis is caused by bacteria that enter the skin through cracks or cuts in the skin. The most common types of bacteria that cause cellulitis are Staphylococcus and Streptococcus. SYMPTOMS   Redness and warmth.  Swelling.  Tenderness or pain.  Fever. DIAGNOSIS  Your caregiver can usually determine what is wrong based on a physical exam. Blood tests may also be done. TREATMENT  Treatment usually involves taking an antibiotic medicine. HOME CARE INSTRUCTIONS   Take your antibiotics as directed. Finish them even if you start to feel better.  Keep the infected arm or leg elevated to reduce swelling.  Apply a warm cloth to the affected area up to 4 times per day to relieve pain.  Only take over-the-counter or prescription medicines for pain, discomfort, or fever as directed by your caregiver.  Keep all follow-up appointments as directed by your caregiver. SEEK MEDICAL CARE IF:   You notice red streaks coming from the infected area.  Your red area gets larger or turns dark in color.  Your bone or joint underneath the infected area becomes  painful after the skin has healed.  Your infection returns in the same area or another area.  You notice a swollen bump in the infected area.  You develop new symptoms. SEEK IMMEDIATE MEDICAL CARE IF:   You have a fever.  You feel very sleepy.  You develop vomiting or diarrhea.  You have a general ill feeling (malaise) with muscle aches and pains. MAKE SURE YOU:   Understand these instructions.  Will watch your condition.  Will get help right away if you are not doing well or get worse. Document Released: 02/05/2005 Document Revised: 10/28/2011 Document Reviewed: 07/14/2011 [facility] Patient Information 2013 [place], [place].     RESOURCE GUIDE  Chronic Pain Problems: Contact [facility]  [phone] Patients need to be referred by their primary care doctor.  Insufficient Money for Medicine: Contact United Way:  call "211" or [facility] 4586778059.  No Primary Care Doctor: - Call Health Connect  [phone] - can help you locate a primary care doctor that  accepts your insurance, provides certain services, etc. - Physician Referral [phone]  Agencies that provide inexpensive medical care: - [facility]  [phone] - [facility]  [phone] - [facility]  [phone] [facility]  [phone] - [facility]  [phone] [facility]  [phone]  Medicaid-accepting [facility] Providers: - [facility]- [address]  [phone], Mon-Fri 9am-7pm, Sat 9am-1pm [phone]  [address] [place], [address] [place]  [phone] - [facility]- [address], [address] [place]  [phone] [facility] [place] [facility]- [address]  [phone] - Renaye Rakers- [address] [place], [address], [phone]  Only accepts [place] patients after they have their name  applied to their card  Self Pay (no  insurance) in [facility]: - Sickle Cell Patients: Dr Willey Blade, [facility]  [address], [phone] - [facility]- [address] [place]  [phone]       [facility]- [address]       -     [facility]- see information above (Speak to [employer] if you do not have insurance)       -  [facility]- [address] [place], [phone]       -  [facility]- [address],  [phone]       -  [facility]- [address] [phone]       -  Dr Julio Sicks-  [address], [place], [phone]       -  [facility]- [address] [phone]       -  [facility]- [address], [phone], also [address], [phone]       -    [facility]- [address] [place], [phone], 1st & 3rd Saturday   every month, 10am-1pm  1) Find a Doctor and Pay Out of Pocket Although you won't have to find out who is covered by your insurance plan, it is a good idea to ask around and get recommendations. You will then need to call the office and see if the doctor you have chosen will accept you as a new patient and what types of options they offer for patients who are self-pay. Some doctors offer discounts or will set up payment plans for their patients who do not have insurance, but you will need to ask so you aren't surprised when you get to your appointment.  2) Contact Your Local Health Department Not all health departments have doctors that can see patients for sick visits, but many do, so it is worth a call to see if yours does. If you don't know where your local health department is, you can check in your phone book. The CDC also has a tool to help you locate your state's health department, and many state websites also have listings of all of their local health departments.  3) Find a Walk-in Clinic If your illness is not likely to be very severe  or complicated, you may want to try a walk in clinic. These are popping up all over the country in pharmacies, drugstores, and shopping centers. They're usually staffed by nurse practitioners or physician assistants that have been trained to treat common illnesses and complaints. They're usually fairly quick and inexpensive. However, if you have serious medical issues or chronic medical problems, these are probably not your best option  STD Testing - [facility] Department of [facility] [place], STD Clinic, [address] [place], phone [phone] or [phone].  Monday - Friday, call for an appointment. [facility] Department of [employer], STD Clinic, [address], [place], phone [phone] or [phone].  Monday - Friday,  call for an appointment.  Abuse/Neglect: [facility] Child Abuse Hotline 626-172-5749 [facility] Child Abuse Hotline 847-595-9991 (After Hours)  Emergency Shelter:  [facility] 701-153-3314  Maternity Homes: - [facility] at [facility] [place] [facility] (513)[phone] - [facility] [phone]  MRSA Hotline #:   573-402-8786  [facility] Resources  [facility]  [facility] Dept. [address]                 [address]         [address] [place] [address]  Blondell Reveal Phone:  [phone]                                  Phone:  [phone]                   Phone:  [phone]  [facility], [phone] - [facility] - [phone]       -     [facility] B[facility]soe in [place], [address]                                  [phone], [facility] Child Abuse Hotline (717)137-7308 or (770)668-9982 (After Hours)   Behavioral Health Services  Substance  Abuse Resources: - [facility]  [phone] - [facility] [phone] - [facility] [phone] Floydene Flock [phone] - Residential & Outpatient Substance Abuse Program  [phone]  Psychological Services: [facility]  2101926177 Services  [phone] - [facility], [phone] [place]. [address], [place], ACCESS LINE: 9128788546 or [phone], [email or web address]za  Dental Assistance  If unable to pay or uninsured, contact:  [facility] or [facility]. to become qualified for the adult dental clinic.  Patients with Medicaid: [facility] [phone] [address], [phone] [address] [phone]  If unable to pay, or uninsured, contact [facility] [phone]) or [facility] Department [phone] in [place], [phone] in [facility]) to become qualified for the adult dental clinic  Other Low-Cost Community Dental Services: - [facility]- [address], [place], [place], [address], [phone], [phone], 2nd and 4th Thursday of the month at 6:30am.  10 clients each day by appointment, can sometimes see walk-in patients if someone does not show for an appointment. [facility]- [address] [place], [place], [address], [phone] - [facility]- [address], [place], [place], [address], [phone] - [place] [facility]- [phone] [facility] Health Department- [phone] [facility] Department- [phone]

## 2012-02-15 NOTE — ED Notes (Signed)
Ortho tech paged for ACE wrap.
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
## 2012-02-17 NOTE — ED Provider Notes (Signed)
Medical screening examination/treatment/procedure(s) were performed by non-physician practitioner and as supervising physician I was immediately available for consultation/collaboration.  Raeford Razor, MD 02/17/12 2358

## 2012-02-18 NOTE — ED Provider Notes (Signed)
Medical screening examination/treatment/procedure(s) were performed by non-physician practitioner and as supervising physician I was immediately available for consultation/collaboration.   Celene Kras, MD 02/18/12 [phone]

## 2015-10-30 ENCOUNTER — Ambulatory Visit (INDEPENDENT_AMBULATORY_CARE_PROVIDER_SITE_OTHER): Payer: BLUE CROSS/BLUE SHIELD

## 2015-10-30 ENCOUNTER — Encounter: Payer: Self-pay | Admitting: Internal Medicine

## 2015-10-30 ENCOUNTER — Ambulatory Visit (INDEPENDENT_AMBULATORY_CARE_PROVIDER_SITE_OTHER): Payer: BLUE CROSS/BLUE SHIELD | Admitting: Internal Medicine

## 2015-10-30 VITALS — BP 138/90 | HR 78 | Temp 98.4°F | Resp 24

## 2015-10-30 DIAGNOSIS — J45909 Unspecified asthma, uncomplicated: Secondary | ICD-10-CM | POA: Insufficient documentation

## 2015-10-30 DIAGNOSIS — J4521 Mild intermittent asthma with (acute) exacerbation: Secondary | ICD-10-CM | POA: Diagnosis not present

## 2015-10-30 DIAGNOSIS — Z720 Tobacco use: Secondary | ICD-10-CM

## 2015-10-30 DIAGNOSIS — J4520 Mild intermittent asthma, uncomplicated: Secondary | ICD-10-CM

## 2015-10-30 DIAGNOSIS — F17200 Nicotine dependence, unspecified, uncomplicated: Secondary | ICD-10-CM | POA: Insufficient documentation

## 2015-10-30 MED ORDER — ALBUTEROL SULFATE HFA 108 (90 BASE) MCG/ACT IN AERS: 1.0000 | INHALATION_SPRAY | Freq: Four times a day (QID) | RESPIRATORY_TRACT | Status: DC | PRN

## 2015-10-30 MED ORDER — IPRATROPIUM BROMIDE 0.02 % IN SOLN
0.5000 mg | Freq: Once | RESPIRATORY_TRACT | Status: AC
  Administered 2015-10-30: 0.5 mg via RESPIRATORY_TRACT

## 2015-10-30 MED ORDER — ALBUTEROL SULFATE (2.5 MG/3ML) 0.083% IN NEBU
2.5000 mg | INHALATION_SOLUTION | Freq: Once | RESPIRATORY_TRACT | Status: AC
  Administered 2015-10-30: 2.5 mg via RESPIRATORY_TRACT

## 2015-10-30 MED ORDER — PREDNISONE 20 MG PO TABS: ORAL_TABLET | ORAL | Status: DC

## 2015-10-30 NOTE — Progress Notes (Signed)
Subjective:  By signing my name below, I, Raven Martin, attest that this documentation has been prepared under the direction and in the presence of Angel Siaobert Doolittle, MD.  Electronically Signed: Andrew Auaven Martin, ED Scribe. 10/30/2015. 3:02 PM.   Patient ID: Angel Martin, male    DOB: 05/17/1970, 45 y.o.   MRN: [id number]  HPI No chief complaint on file.   HPI Comments: Angel Martin is a 45 y.o. male who [facility] complaining of SOB. Pt states while at work yesterday, moving boxes, a pile of dust fell on him. Since then, he's had gradually worsening SOB. He also had some initial sneezing and rhinorrhea. He had trouble sleeping last night due to symptoms of wheezing and SOB. Pt has trouble speaking in full sentences. He tried to use his inhaler last night but now out.  Hx intermittent asthma without needing daily meds   There are no active problems to display for this patient.  Past Medical History  Diagnosis Date  . Asthma    Past Surgical History  Procedure Laterality Date  . Achilles tendon repair     No Known Allergies Prior to Admission medications   Medication Sig Start Date End Date Taking? Authorizing Provider  albuterol (PROVENTIL HFA;VENTOLIN HFA) 108 (90 BASE) MCG/ACT inhaler Inhale 1 puff into the lungs every 6 (six) hours as needed. For shortness of breath    Historical Provider, MD  cephALEXin (KEFLEX) 500 MG capsule Take 1 capsule (500 mg total) by mouth 4 (four) times daily. 02/15/12   Ivonne AndrewPeter Dammen, PA-C  ibuprofen (ADVIL,MOTRIN) 200 MG tablet Take 800 mg by mouth every 6 (six) hours as needed. For pain    Historical Provider, MD  neomycin-polymyxin-pramoxine (NEOSPORIN PLUS) 1 % cream Apply 1 application topically 2 (two) times daily. Applies to scraped leg.    Historical Provider, MD   Social History   Social History  . Marital Status: Married    Spouse Name: N/A  . Number of Children: N/A  . Years of Education: N/A   Occupational  History  . Not on file.   Social History Main Topics  . Smoking status: Current Every Day Smoker -- 0.50 packs/day for 4 years    Types: Cigarettes  . Smokeless tobacco: Not on file  . Alcohol Use: No  . Drug Use: No  . Sexual Activity: Not on file   Other Topics Concern  . Not on file   Social History Narrative  . No narrative on file   Review of Systems  HENT: Positive for rhinorrhea and sneezing.   Respiratory: Positive for shortness of breath and wheezing.   Psychiatric/Behavioral: Positive for sleep disturbance.  --last night     Objective:   Physical Exam  Constitutional: He is oriented to person, place, and time. He appears well-developed and well-nourished.  Working to breath/apprehensive  HENT:  Head: Normocephalic and atraumatic.  Right Ear: External ear normal.  Left Ear: External ear normal.  Nose: Nose normal.  Mouth/Throat: Oropharynx is clear and moist.  Eyes: Conjunctivae and EOM are normal. Pupils are equal, round, and reactive to light. Left eye exhibits no discharge.  Neck: Neck supple.  Cardiovascular: Normal rate, regular rhythm, normal heart sounds and intact distal pulses.   No murmur heard. Pulmonary/Chest: He has wheezes ( throughout expiration bilaterally). He has no rales.  Mild tachypnea  Musculoskeletal: Normal range of motion. He exhibits no edema.  Lymphadenopathy:    He has no cervical adenopathy.  Neurological: He is alert and oriented to person, place, and time.  Skin: Skin is warm and dry.  Psychiatric: He has a normal mood and affect. His behavior is normal.  Nursing note and vitals reviewed. BP 138/90 mmHg  Pulse 78  Temp(Src) 98.4 F (36.9 C) (Oral)  Resp 24  SpO2 99%   neb with albut and atrovent cleared the wheezing CXR= no underlying abnormalities  Assessment & Plan:  Asthma, mild intermittent, with acute exacerbation - Plan:meds post rx  Smoker---discussed need to quit  Meds ordered this encounter  Medications    . albuterol (PROVENTIL) (2.5 MG/3ML) 0.083% nebulizer solution 2.5 mg    Sig:   . ipratropium (ATROVENT) nebulizer solution 0.5 mg    Sig:   . predniSONE (DELTASONE) 20 MG tablet    Sig: 3/3/2/2/1/1 single daily dose for 6 days    Dispense:  12 tablet    Refill:  0  . albuterol (PROVENTIL HFA;VENTOLIN HFA) 108 (90 Base) MCG/ACT inhaler    Sig: Inhale 1 puff into the lungs every 6 (six) hours as needed. For wheezing    Dispense:  1 Inhaler    Refill:  5  oow 2d I have completed the patient encounter in its entirety as documented by the scribe, with editing by me where necessary. Robert P. Merla Riches, M.D.

## 2016-02-19 ENCOUNTER — Ambulatory Visit (INDEPENDENT_AMBULATORY_CARE_PROVIDER_SITE_OTHER): Payer: BLUE CROSS/BLUE SHIELD | Admitting: Physician Assistant

## 2016-02-19 VITALS — BP 122/72 | HR 75 | Temp 98.5°F | Resp 17 | Ht 71.5 in | Wt 231.0 lb

## 2016-02-19 DIAGNOSIS — R112 Nausea with vomiting, unspecified: Secondary | ICD-10-CM

## 2016-02-19 MED ORDER — ONDANSETRON 4 MG PO TBDP: 4.0000 mg | ORAL_TABLET | Freq: Three times a day (TID) | ORAL | 0 refills | Status: DC | PRN

## 2016-02-19 NOTE — Progress Notes (Signed)
Angel Martin  MRN: [id number] DOB: [birth year]  Subjective:  Angel Martin is a 45 y.o. male seen in office today for a chief complaint of nausea and vomiting x 1 day.   Pt did not eat during the day yesterday (notes that he typically does not eat during the day because he likes to wait for his wife's cooking at night time) but when he got home he had two plates of spaghetti. 3 hours after eating, he laid down and felt kind of bad. He then made himself throw up, went to bed, and then had to wake up a few more times to throw up throughout the night. He woke up for work this morning and felt nauseous and threw up one time today on the way to work.  Pt notes there is a GI bug going around at work. A few of his coworkers have been out sick over the weekend.   Pt has not drank much water today. Last bowel movement was yesterday and it was normal. He is having normal urine output today.   Review of Systems  Constitutional: Negative for chills, fatigue and fever.  Gastrointestinal: Negative for abdominal pain and diarrhea.  Genitourinary: Positive for difficulty urinating.  Musculoskeletal: Positive for myalgias.  Allergic/Immunologic: Positive for environmental allergies.  Neurological: Positive for weakness. Negative for dizziness, light-headedness and headaches.    Patient Active Problem List   Diagnosis Date Noted  . Asthma 10/30/2015  . Smoker 10/30/2015    Current Outpatient Prescriptions on File Prior to Visit  Medication Sig Dispense Refill  . albuterol (PROVENTIL HFA;VENTOLIN HFA) 108 (90 Base) MCG/ACT inhaler Inhale 1 puff into the lungs every 6 (six) hours as needed. For wheezing 1 Inhaler 5  . cephALEXin (KEFLEX) 500 MG capsule Take 1 capsule (500 mg total) by mouth 4 (four) times daily. (Patient not taking: Reported on 02/19/2016) 28 capsule 0  . ibuprofen (ADVIL,MOTRIN) 200 MG tablet Take 800 mg by mouth every 6 (six) hours as needed. For pain    . neomycin-polymyxin-pramoxine  (NEOSPORIN PLUS) 1 % cream Apply 1 application topically 2 (two) times daily. Applies to scraped leg.    . predniSONE (DELTASONE) 20 MG tablet 3/3/2/2/1/1 single daily dose for 6 days (Patient not taking: Reported on 02/19/2016) 12 tablet 0   No current facility-administered medications on file prior to visit.     No Known Allergies  Objective:  BP 122/72 (BP Location: Right Arm, Patient Position: Sitting, Cuff Size: Normal)   Pulse 75   Temp 98.5 F (36.9 C) (Oral)   Resp 17   Ht 5' 11.5" (1.816 m)   Wt 231 lb (104.8 kg)   SpO2 98%   BMI 31.77 kg/m   Physical Exam  Constitutional: He is oriented to person, place, and time.  Well developed, well nourished, in mild distress sitting on exam table.   HENT:  Head: Normocephalic and atraumatic.  Mouth/Throat: Uvula is midline, oropharynx is clear and moist and mucous membranes are normal.  Eyes: Conjunctivae are normal.  Neck: Normal range of motion.  Pulmonary/Chest: Effort normal. He has wheezes ( intermittent in posterior lung fields).  Abdominal: Soft. Bowel sounds are hyperactive. There is generalized tenderness. There is no rigidity, no rebound, no guarding, no tenderness at McBurney's point and negative Murphy's sign.  Neurological: He is alert and oriented to person, place, and time. Gait normal.  Skin: Skin is warm and dry.  Psychiatric: Affect normal.  Vitals reviewed.  Orthostatic VS for the past 24  hrs:  BP- Lying Pulse- Lying BP- Sitting Pulse- Sitting BP- Standing at 0 minutes Pulse- Standing at 0 minutes  02/19/16 1004 (!) 144/95 64 (!) 141/97 71 (!) 141/105 74    Assessment and Plan :  1. Nausea and vomiting, intractability of vomiting not specified, unspecified vomiting type -Likely viral gastritis  -Recommend oral rehydration with water.  -BRAT diet, advance diet as tolerated. - Orthostatic vital signs - ondansetron (ZOFRAN ODT) 4 MG disintegrating tablet; Take 1 tablet (4 mg total) by mouth every 8 (eight)  hours as needed for nausea or vomiting.  Dispense: 20 tablet; Refill: 0 -Instructed to return to clinic tomorrow if symptoms do not improve, seek care sooner if symptoms worsen.  Benjiman CoreBrittany Bobbiejo Ishikawa PA-C  [facility] 02/19/2016 9:27 AM

## 2016-02-19 NOTE — Patient Instructions (Addendum)
Take zofran as needed for nausea and vomiting.  I recommend resting today and hydrating with water continuously throughout the day.  Advance diet as you feel better. Start with BRAT (banana, rice, apple, toast aka bland diet) and add things on as you feel better.  If you are not any better tomorrow, please return to office.   IF you received an x-ray today, you will receive an invoice from [facility]. Please contact [facility] at [phone] with questions or concerns regarding your invoice.   IF you received labwork today, you will receive an invoice from [facility]. Please contact [facility] at [phone] with questions or concerns regarding your invoice.   Our billing staff will not be able to assist you with questions regarding bills from these companies.  You will be contacted with the lab results as soon as they are available. The fastest way to get your results is to activate your My Chart account. Instructions are located on the last page of this paperwork. If you have not heard from us regarding the results in 2 weeks, please contact this office.

## 2016-04-14 ENCOUNTER — Ambulatory Visit (INDEPENDENT_AMBULATORY_CARE_PROVIDER_SITE_OTHER): Payer: BLUE CROSS/BLUE SHIELD

## 2016-04-14 ENCOUNTER — Ambulatory Visit (INDEPENDENT_AMBULATORY_CARE_PROVIDER_SITE_OTHER): Payer: BLUE CROSS/BLUE SHIELD | Admitting: Physician Assistant

## 2016-04-14 VITALS — BP 136/90 | HR 93 | Temp 98.2°F | Resp 17 | Ht 71.5 in | Wt 231.0 lb

## 2016-04-14 DIAGNOSIS — R062 Wheezing: Secondary | ICD-10-CM | POA: Diagnosis not present

## 2016-04-14 DIAGNOSIS — J45901 Unspecified asthma with (acute) exacerbation: Secondary | ICD-10-CM | POA: Diagnosis not present

## 2016-04-14 LAB — POCT CBC
GRANULOCYTE PERCENT: 77.9 % (ref 37–80)
HEMATOCRIT: 45 % (ref 43.5–53.7)
Hemoglobin: 15.3 g/dL (ref 14.1–18.1)
Lymph, poc: 3 (ref 0.6–3.4)
MCH, POC: 29.2 pg (ref 27–31.2)
MCHC: 33.9 g/dL (ref 31.8–35.4)
MCV: 86 fL (ref 80–97)
MID (CBC): 0.4 (ref 0–0.9)
MPV: 7.9 fL (ref 0–99.8)
POC GRANULOCYTE: 12 — AB (ref 2–6.9)
POC LYMPH %: 19.6 % (ref 10–50)
POC MID %: 2.5 %M (ref 0–12)
Platelet Count, POC: 205 10*3/uL (ref 142–424)
RBC: 5.23 M/uL (ref 4.69–6.13)
RDW, POC: 14.9 %
WBC: 15.4 10*3/uL — AB (ref 4.6–10.2)

## 2016-04-14 LAB — POCT INFLUENZA A/B
Influenza A, POC: NEGATIVE
Influenza B, POC: NEGATIVE

## 2016-04-14 LAB — GLUCOSE, POCT (MANUAL RESULT ENTRY): POC GLUCOSE: 80 mg/dL (ref 70–99)

## 2016-04-14 MED ORDER — IPRATROPIUM BROMIDE 0.02 % IN SOLN
0.5000 mg | Freq: Once | RESPIRATORY_TRACT | Status: AC
  Administered 2016-04-14: 0.5 mg via RESPIRATORY_TRACT

## 2016-04-14 MED ORDER — ALBUTEROL SULFATE (2.5 MG/3ML) 0.083% IN NEBU
2.5000 mg | INHALATION_SOLUTION | Freq: Once | RESPIRATORY_TRACT | Status: AC
  Administered 2016-04-14: 2.5 mg via RESPIRATORY_TRACT

## 2016-04-14 MED ORDER — PREDNISONE 20 MG PO TABS: ORAL_TABLET | ORAL | 0 refills | Status: DC

## 2016-04-14 MED ORDER — AZITHROMYCIN 500 MG PO TABS: 500.0000 mg | ORAL_TABLET | Freq: Every day | ORAL | 0 refills | Status: DC

## 2016-04-14 MED ORDER — METHYLPREDNISOLONE SODIUM SUCC 125 MG IJ SOLR
125.0000 mg | Freq: Once | INTRAMUSCULAR | Status: AC
  Administered 2016-04-14: 125 mg via INTRAVENOUS

## 2016-04-14 MED ORDER — BECLOMETHASONE DIPROPIONATE 40 MCG/ACT IN AERS: 1.0000 | INHALATION_SPRAY | Freq: Two times a day (BID) | RESPIRATORY_TRACT | 12 refills | Status: DC

## 2016-04-14 NOTE — Patient Instructions (Addendum)
Start oral prednisone taper tomorrow morning.  Start azithromycin today.   You can use your albuterol inhaler every 4-6 hours as needed for wheezing and SOB. Start QVAR inhaler tomorrow.  If you have any worsening SOB, difficulty breathing, chest tightness, worsening cough, or fever go to the ER immediately.   Otherwise, follow up with me tomorrow afternooon in office.    Asthma, Acute Bronchospasm Acute bronchospasm caused by asthma is also referred to as an asthma attack. Bronchospasm means your air passages become narrowed. The narrowing is caused by inflammation and tightening of the muscles in the air tubes (bronchi) in your lungs. This can make it hard to breathe or cause you to wheeze and cough. What are the causes? Possible triggers are:  Animal dander from the skin, hair, or feathers of animals.  Dust mites contained in house dust.  Cockroaches.  Pollen from trees or grass.  Mold.  Cigarette or tobacco smoke.  Air pollutants such as dust, household cleaners, hair sprays, aerosol sprays, paint fumes, strong chemicals, or strong odors.  Cold air or weather changes. Cold air may trigger inflammation. Winds increase molds and pollens in the air.  Strong emotions such as crying or laughing hard.  Stress.  Certain medicines such as aspirin or beta-blockers.  Sulfites in foods and drinks, such as dried fruits and wine.  Infections or inflammatory conditions, such as a flu, cold, or inflammation of the nasal membranes (rhinitis).  Gastroesophageal reflux disease (GERD). GERD is a condition where stomach acid backs up into your esophagus.  Exercise or strenuous activity. What are the signs or symptoms?  Wheezing.  Excessive coughing, particularly at night.  Chest tightness.  Shortness of breath. How is this diagnosed? Your health care provider will ask you about your medical history and perform a physical exam. A chest X-ray or blood testing may be performed  to look for other causes of your symptoms or other conditions that may have triggered your asthma attack. How is this treated? Treatment is aimed at reducing inflammation and opening up the airways in your lungs. Most asthma attacks are treated with inhaled medicines. These include quick relief or rescue medicines (such as bronchodilators) and controller medicines (such as inhaled corticosteroids). These medicines are sometimes given through an inhaler or a nebulizer. Systemic steroid medicine taken by mouth or given through an IV tube also can be used to reduce the inflammation when an attack is moderate or severe. Antibiotic medicines are only used if a bacterial infection is present. Follow these instructions at home:  Rest.  Drink plenty of liquids. This helps the mucus to remain thin and be easily coughed up. Only use caffeine in moderation and do not use alcohol until you have recovered from your illness.  Do not smoke. Avoid being exposed to secondhand smoke.  You play a critical role in keeping yourself in good health. Avoid exposure to things that cause you to wheeze or to have breathing problems.  Keep your medicines up-to-date and available. Carefully follow your health care provider's treatment plan.  Take your medicine exactly as prescribed.  When pollen or pollution is bad, keep windows closed and use an air conditioner or go to places with air conditioning.  Asthma requires careful medical care. See your health care provider for a follow-up as advised. If you are more than [redacted] weeks pregnant and you were prescribed any new medicines, let your obstetrician know about the visit and how you are doing. Follow up with your health care  provider as directed.  After you have recovered from your asthma attack, make an appointment with your outpatient doctor to talk about ways to reduce the likelihood of future attacks. If you do not have a doctor who manages your asthma, make an appointment  with a primary care doctor to discuss your asthma. Get help right away if:  You are getting worse.  You have trouble breathing. If severe, call your local emergency services (911 in the [place]  You develop chest pain or discomfort.  You are vomiting.  You are not able to keep fluids down.  You are coughing up yellow, green, brown, or bloody sputum.  You have a fever and your symptoms suddenly get worse.  You have trouble swallowing. This information is not intended to replace advice given to you by your health care provider. Make sure you discuss any questions you have with your health care provider. Document Released: 08/13/2006 Document Revised: 10/10/2015 Document Reviewed: 11/03/2012 Elsevier Interactive Patient Education  2017 [employer]    IF you received an x-ray today, you will receive an invoice from [facility]. Please contact [facility] at [phone] with questions or concerns regarding your invoice.   IF you received labwork today, you will receive an invoice from [facility]. Please contact [facility] at [phone] with questions or concerns regarding your invoice.   Our billing staff will not be able to assist you with questions regarding bills from these companies.  You will be contacted with the lab results as soon as they are available. The fastest way to get your results is to activate your My Chart account. Instructions are located on the last page of this paperwork. If you have not heard from us regarding the results in 2 weeks, please contact this office.

## 2016-04-14 NOTE — Progress Notes (Signed)
Iv d/c w/o incident Peak flow 200, 250 and 300

## 2016-04-14 NOTE — Progress Notes (Signed)
Angel Martin  MRN: [id number] DOB: 07/13/1970  Subjective:  Angel Martin is a 45 y.o. male seen in office today for a chief complaint of SOB x 2 days. Notes that three days ago he developed body aches, cough, fever, and congestion, which seemed to have resolved with use of tylenol flu and rest. However, the SOB has persisted and worsened over the past day. Denies chest pain, heart palpitations, lower leg swelling,and pleuritic chest pain. Pt has a hisotry of asthma. He only has a rescue inhaler and has not ever been on a maintenance inhaler.  His last exacerbation was 10/2015 Notes he uses his rescue inhaler every week but for the past two days he has been using it nonstop. He takes allegra intermittently for allergies. Pt has not had the flu shot this year. Pt recently quit smoking, he is now vaping.    Review of Systems  Constitutional: Negative for chills and diaphoresis.  Respiratory: Positive for wheezing.   Cardiovascular: Negative for palpitations and leg swelling.  Gastrointestinal: Negative for abdominal pain, diarrhea, nausea and vomiting.  Neurological: Negative for dizziness, light-headedness and headaches.    Patient Active Problem List   Diagnosis Date Noted  . Asthma 10/30/2015  . Smoker 10/30/2015    Current Outpatient Prescriptions on File Prior to Visit  Medication Sig Dispense Refill  . albuterol (PROVENTIL HFA;VENTOLIN HFA) 108 (90 Base) MCG/ACT inhaler Inhale 1 puff into the lungs every 6 (six) hours as needed. For wheezing 1 Inhaler 5  . ibuprofen (ADVIL,MOTRIN) 200 MG tablet Take 800 mg by mouth every 6 (six) hours as needed. For pain     No current facility-administered medications on file prior to visit.     No Known Allergies   Social History   Social History  . Marital status: Married    Spouse name: N/A  . Number of children: N/A  . Years of education: N/A   Occupational History  . Not on file.   Social History Main Topics  . Smoking status:  Former Smoker    Packs/day: 0.50    Years: 4.00    Types: Cigarettes    Quit date: 03/16/2016  . Smokeless tobacco: Not on file  . Alcohol use No  . Drug use: No  . Sexual activity: Not on file   Other Topics Concern  . Not on file   Social History Narrative  . No narrative on file      Objective:  BP 136/90 (BP Location: Right Arm, Patient Position: Sitting, Cuff Size: Large)   Pulse 93   Temp 98.2 F (36.8 C) (Oral)   Resp 17   Ht 5' 11.5" (1.816 m)   Wt 231 lb (104.8 kg)   SpO2 93%   PF 300 L/min   BMI 31.77 kg/m   Physical Exam  Constitutional: He is oriented to person, place, and time. He appears distressed (moderate).  HENT:  Head: Normocephalic and atraumatic.  Right Ear: Tympanic membrane, external ear and ear canal normal.  Left Ear: Tympanic membrane, external ear and ear canal normal.  Nose: Mucosal edema present. Right sinus exhibits no maxillary sinus tenderness and no frontal sinus tenderness. Left sinus exhibits no maxillary sinus tenderness and no frontal sinus tenderness.  Mouth/Throat: Uvula is midline and mucous membranes are normal. Posterior oropharyngeal erythema present.  Eyes: Conjunctivae are normal.  Neck: Normal range of motion.  Cardiovascular: Normal rate, regular rhythm and normal heart sounds.   Pulmonary/Chest: Effort normal. No accessory muscle usage. Tachypnea  noted. He has wheezes ( diffuse).  Lymphadenopathy:       Head (right side): No submental, no submandibular, no tonsillar, no preauricular, no posterior auricular and no occipital adenopathy present.       Head (left side): No submental, no submandibular, no tonsillar, no preauricular, no posterior auricular and no occipital adenopathy present.    He has no cervical adenopathy.       Right: No supraclavicular adenopathy present.       Left: No supraclavicular adenopathy present.  Neurological: He is alert and oriented to person, place, and time. Gait normal.  Skin: Skin is warm  and dry.  Psychiatric: Affect normal.  Vitals reviewed.  Post duonebs pt states he feels much better. Wheezes still auscultated diffusely throughout lung fields. SpO2 at 95%.   Pre peak flow was unobtainable, post peak flow ranges from 275-350.   Results for orders placed or performed in visit on 04/14/16 (from the past 24 hour(s))  POCT glucose (manual entry)     Status: None   Collection Time: 04/14/16  6:40 PM  Result Value Ref Range   POC Glucose 80 70 - 99 mg/dl  POCT CBC     Status: Abnormal   Collection Time: 04/14/16  6:46 PM  Result Value Ref Range   WBC 15.4 (A) 4.6 - 10.2 K/uL   Lymph, poc 3.0 0.6 - 3.4   POC LYMPH PERCENT 19.6 10 - 50 %L   MID (cbc) 0.4 0 - 0.9   POC MID % 2.5 0 - 12 %M   POC Granulocyte 12.0 (A) 2 - 6.9   Granulocyte percent 77.9 37 - 80 %G   RBC 5.23 4.69 - 6.13 M/uL   Hemoglobin 15.3 14.1 - 18.1 g/dL   HCT, POC 74.2 59.5 - 53.7 %   MCV 86.0 80 - 97 fL   MCH, POC 29.2 27 - 31.2 pg   MCHC 33.9 31.8 - 35.4 g/dL   RDW, POC 63.8 %   Platelet Count, POC 205 142 - 424 K/uL   MPV 7.9 0 - 99.8 fL  POCT Influenza A/B     Status: None   Collection Time: 04/14/16  7:44 PM  Result Value Ref Range   Influenza A, POC Negative Negative   Influenza B, POC Negative Negative    Dg Chest 2 View  Result Date: 04/14/2016 CLINICAL DATA:  Wheezing and shortness of breath today. EXAM: CHEST  2 VIEW COMPARISON:  PA and lateral chest 10/30/2015 and 04/20/2010. FINDINGS: There is some peribronchial thickening. No consolidative process, pneumothorax or effusion. Heart size is normal. No focal bony abnormality. IMPRESSION: Bronchitic change.  No focal process. Electronically Signed   By: Drusilla Kanner M.D.   On: 04/14/2016 19:32    Assessment and Plan :  This case was precepted with Dr. Clelia Croft  1. Wheezing - Improved, but still present. Will discharge home with medications with close follow up tomorrow. - albuterol (PROVENTIL) (2.5 MG/3ML) 0.083% nebulizer solution  2.5 mg; Take 3 mLs (2.5 mg total) by nebulization once. - ipratropium (ATROVENT) nebulizer solution 0.5 mg; Take 2.5 mLs (0.5 mg total) by nebulization once. - albuterol (PROVENTIL) (2.5 MG/3ML) 0.083% nebulizer solution 2.5 mg; Take 3 mLs (2.5 mg total) by nebulization once. - ipratropium (ATROVENT) nebulizer solution 0.5 mg; Take 2.5 mLs (0.5 mg total) by nebulization once. - methylPREDNISolone sodium succinate (SOLU-MEDROL) 125 mg/2 mL injection 125 mg; Inject 2 mLs (125 mg total) into the vein once. - POCT glucose (manual entry) -  POCT CBC - albuterol (PROVENTIL) (2.5 MG/3ML) 0.083% nebulizer solution 2.5 mg; Take 3 mLs (2.5 mg total) by nebulization once. - ipratropium (ATROVENT) nebulizer solution 0.5 mg; Take 2.5 mLs (0.5 mg total) by nebulization once. - POCT Influenza A/B - DG Chest 2 View; Future  2. Exacerbation of asthma, unspecified asthma severity, unspecified whether persistent -Pt improved significantly after 3 duonebs and IV solumedrol however he did not return to his baselin. - azithromycin (ZITHROMAX) 500 MG tablet; Take 1 tablet (500 mg total) by mouth daily.  Dispense: 3 tablet; Refill: 0 - beclomethasone (QVAR) 40 MCG/ACT inhaler; Inhale 1 puff into the lungs 2 (two) times daily.  Dispense: 1 Inhaler; Refill: 12 - predniSONE (DELTASONE) 20 MG tablet; Take 3 PO QAM x3days, 2 PO QAM x3days, 1 PO QAM x3days  Dispense: 18 tablet; Refill: 0 -Pt instructed that if he has any worsening SOB, difficulty breathing, chest tightness, worsening cough, or fever over the night to go to the ER immediately. Otherwise, follow up with me in clinic in 24 hours.    Benjiman Core PA-C  [facility] 04/14/2016 7:45 PM

## 2016-04-15 ENCOUNTER — Encounter: Payer: Self-pay | Admitting: Physician Assistant

## 2016-12-02 ENCOUNTER — Telehealth: Payer: Self-pay

## 2016-12-02 NOTE — Telephone Encounter (Signed)
Proair HFA rx refill request denied because pt hasn't been seen from 6 months to a year. [facility] was faxed back being denied and stating that pt needs a appointment with a new provider. Received success notice.

## 2018-01-11 ENCOUNTER — Ambulatory Visit (HOSPITAL_COMMUNITY)
Admission: EM | Admit: 2018-01-11 | Discharge: 2018-01-11 | Disposition: A | Discharge status: Home / Self Care | Payer: Managed Care, Other (non HMO) | Source: Home / Self Care

## 2018-01-11 ENCOUNTER — Emergency Department (HOSPITAL_COMMUNITY)
Admission: EM | Admit: 2018-01-11 | Discharge: 2018-01-11 | Disposition: A | Discharge status: Home / Self Care | Payer: Managed Care, Other (non HMO) | Attending: Emergency Medicine | Admitting: Emergency Medicine

## 2018-01-11 ENCOUNTER — Encounter (HOSPITAL_COMMUNITY): Payer: Self-pay | Admitting: Emergency Medicine

## 2018-01-11 ENCOUNTER — Emergency Department (HOSPITAL_COMMUNITY): Payer: Managed Care, Other (non HMO)

## 2018-01-11 ENCOUNTER — Other Ambulatory Visit: Payer: Self-pay

## 2018-01-11 DIAGNOSIS — Z79899 Other long term (current) drug therapy: Secondary | ICD-10-CM | POA: Diagnosis not present

## 2018-01-11 DIAGNOSIS — J4541 Moderate persistent asthma with (acute) exacerbation: Secondary | ICD-10-CM | POA: Insufficient documentation

## 2018-01-11 DIAGNOSIS — J181 Lobar pneumonia, unspecified organism: Secondary | ICD-10-CM | POA: Diagnosis not present

## 2018-01-11 DIAGNOSIS — R0602 Shortness of breath: Secondary | ICD-10-CM | POA: Diagnosis present

## 2018-01-11 DIAGNOSIS — Z87891 Personal history of nicotine dependence: Secondary | ICD-10-CM | POA: Diagnosis not present

## 2018-01-11 DIAGNOSIS — J189 Pneumonia, unspecified organism: Secondary | ICD-10-CM

## 2018-01-11 LAB — COMPREHENSIVE METABOLIC PANEL
ALBUMIN: 4.3 g/dL (ref 3.5–5.0)
ALT: 25 U/L (ref 0–44)
ANION GAP: 9 (ref 5–15)
AST: 24 U/L (ref 15–41)
Alkaline Phosphatase: 67 U/L (ref 38–126)
BUN: 10 mg/dL (ref 6–20)
CHLORIDE: 109 mmol/L (ref 98–111)
CO2: 24 mmol/L (ref 22–32)
Calcium: 9.1 mg/dL (ref 8.9–10.3)
Creatinine, Ser: 1.09 mg/dL (ref 0.61–1.24)
GFR calc Af Amer: >60 mL/min (ref >60)
GFR calc non Af Amer: >60 mL/min (ref >60)
Glucose, Bld: 106 mg/dL — ABNORMAL HIGH (ref 70–99)
Potassium: 3.8 mmol/L (ref 3.5–5.1)
Sodium: 142 mmol/L (ref 135–145)
TOTAL PROTEIN: 7.5 g/dL (ref 6.5–8.1)
Total Bilirubin: 1.6 mg/dL — ABNORMAL HIGH (ref 0.3–1.2)

## 2018-01-11 LAB — I-STAT TROPONIN, ED: Troponin i, poc: 0.03 ng/mL (ref 0.00–0.08)

## 2018-01-11 LAB — CBC
HCT: 48.6 % (ref 39.0–52.0)
Hemoglobin: 15.4 g/dL (ref 13.0–17.0)
MCH: 28.9 pg (ref 26.0–34.0)
MCHC: 31.7 g/dL (ref 30.0–36.0)
MCV: 91.2 fL (ref 78.0–100.0)
PLATELETS: 227 10*3/uL (ref 150–400)
RBC: 5.33 MIL/uL (ref 4.22–5.81)
RDW: 14.6 % (ref 11.5–15.5)
WBC: 14.9 10*3/uL — ABNORMAL HIGH (ref 4.0–10.5)

## 2018-01-11 MED ORDER — IPRATROPIUM BROMIDE 0.02 % IN SOLN: 0.5000 mg | Freq: Once | RESPIRATORY_TRACT | Status: DC

## 2018-01-11 MED ORDER — AZITHROMYCIN 250 MG PO TABS: 250.0000 mg | ORAL_TABLET | Freq: Every day | ORAL | 0 refills | Status: DC

## 2018-01-11 MED ORDER — ALBUTEROL (5 MG/ML) CONTINUOUS INHALATION SOLN
10.0000 mg/h | INHALATION_SOLUTION | RESPIRATORY_TRACT | Status: DC
  Administered 2018-01-11: 10 mg/h via RESPIRATORY_TRACT

## 2018-01-11 MED ORDER — PREDNISONE 10 MG PO TABS: 40.0000 mg | ORAL_TABLET | Freq: Every day | ORAL | 0 refills | Status: AC

## 2018-01-11 MED ORDER — METHYLPREDNISOLONE SODIUM SUCC 125 MG IJ SOLR
125.0000 mg | Freq: Once | INTRAMUSCULAR | Status: AC
  Administered 2018-01-11: 125 mg via INTRAVENOUS
  Filled 2018-01-11: qty 2

## 2018-01-11 MED ORDER — ALBUTEROL (5 MG/ML) CONTINUOUS INHALATION SOLN
10.0000 mg/h | INHALATION_SOLUTION | RESPIRATORY_TRACT | Status: AC
  Administered 2018-01-11: 10 mg/h via RESPIRATORY_TRACT

## 2018-01-11 MED ORDER — SODIUM CHLORIDE 0.9 % IV SOLN
1.0000 g | Freq: Once | INTRAVENOUS | Status: AC
  Administered 2018-01-11: 1 g via INTRAVENOUS
  Filled 2018-01-11: qty 10

## 2018-01-11 MED ORDER — MAGNESIUM SULFATE 2 GM/50ML IV SOLN
2.0000 g | Freq: Once | INTRAVENOUS | Status: AC
  Administered 2018-01-11: 2 g via INTRAVENOUS
  Filled 2018-01-11: qty 50

## 2018-01-11 MED ORDER — ALBUTEROL (5 MG/ML) CONTINUOUS INHALATION SOLN
INHALATION_SOLUTION | RESPIRATORY_TRACT | Status: AC
  Administered 2018-01-11: 10 mg/h via RESPIRATORY_TRACT
  Filled 2018-01-11: qty 20

## 2018-01-11 MED ORDER — SODIUM CHLORIDE 0.9 % IV BOLUS: 1000.0000 mL | Freq: Once | INTRAVENOUS | Status: DC

## 2018-01-11 MED ORDER — SODIUM CHLORIDE 0.9 % IV SOLN
500.0000 mg | Freq: Once | INTRAVENOUS | Status: AC
  Administered 2018-01-11: 500 mg via INTRAVENOUS
  Filled 2018-01-11: qty 500

## 2018-01-11 MED ORDER — IPRATROPIUM BROMIDE 0.02 % IN SOLN
0.5000 mg | Freq: Once | RESPIRATORY_TRACT | Status: AC
  Administered 2018-01-11: 0.5 mg via RESPIRATORY_TRACT
  Filled 2018-01-11: qty 2.5

## 2018-01-11 MED ORDER — ALBUTEROL (5 MG/ML) CONTINUOUS INHALATION SOLN: 7.5000 mg/h | INHALATION_SOLUTION | RESPIRATORY_TRACT | Status: DC

## 2018-01-11 MED ORDER — PREDNISONE 10 MG PO TABS: 40.0000 mg | ORAL_TABLET | Freq: Every day | ORAL | 0 refills | Status: DC

## 2018-01-11 NOTE — ED Triage Notes (Signed)
Pt arrives with c/o asthma attack that began last night, states he used his inhaler without relief. Respirations labored and pt tripoding in triage, O2 sats 85% on room air.

## 2018-01-11 NOTE — ED Notes (Signed)
Pt speaking in full sentences. Having SOB. Pt states he cant wait, decided to go to the ER.

## 2018-01-11 NOTE — ED Provider Notes (Signed)
[facility] EMERGENCY DEPARTMENT Provider Note   CSN: [id number] Arrival date & time: 01/11/18  1044     History   Chief Complaint Chief Complaint  Patient presents with  . Asthma    HPI Angel Martin is a 47 y.o. male.  HPI   47 year old male with a history of asthma and smoking presents with concern for cough and shortness of breath.  Reports cough over the last 3 days with dyspnea beginning last night and worsening this AM. Wife diagnosed with sinus infection recently.  Tried taking albuterol at home however did not improve. Has been to the ED for asthma, but has not been admitted or intubated.  Reports dyspnea severe today. No chest pain, no fever.   Past Medical History:  Diagnosis Date  . Asthma     Patient Active Problem List   Diagnosis Date Noted  . Asthma 10/30/2015  . Smoker 10/30/2015    Past Surgical History:  Procedure Laterality Date  . ACHILLES TENDON REPAIR          Home Medications    Prior to Admission medications   Medication Sig Start Date End Date Taking? Authorizing Provider  albuterol (PROVENTIL HFA;VENTOLIN HFA) 108 (90 Base) MCG/ACT inhaler Inhale 1 puff into the lungs every 6 (six) hours as needed. For wheezing 10/30/15  Yes Tonye Pearson, MD  montelukast (SINGULAIR) 10 MG tablet Take 10 mg by mouth every morning. 12/16/17  Yes [provider]  azithromycin (ZITHROMAX) 250 MG tablet Take 1 tablet (250 mg total) by mouth daily. Take first 2 tablets together, then 1 every day until finished. 01/11/18   Alvira Monday, MD  beclomethasone (QVAR) 40 MCG/ACT inhaler Inhale 1 puff into the lungs 2 (two) times daily. Patient not taking: Reported on 01/11/2018 04/14/16   Benjiman Core D, PA-C  predniSONE (DELTASONE) 10 MG tablet Take 4 tablets (40 mg total) by mouth daily for 4 days. 01/11/18 01/15/18  Alvira Monday, MD    Family History No family history on file.  Social History Social History   Tobacco Use  .  Smoking status: Former Smoker    Packs/day: 0.50    Years: 4.00    Pack years: 2.00    Types: Cigarettes    Last attempt to quit: 03/16/2016    Years since quitting: 1.8  Substance Use Topics  . Alcohol use: No  . Drug use: No     Allergies   Patient has no known allergies.   Review of Systems Review of Systems  Constitutional: Negative for fever.  HENT: Positive for congestion. Negative for sore throat.   Eyes: Negative for visual disturbance.  Respiratory: Positive for cough, shortness of breath and wheezing.   Cardiovascular: Negative for chest pain.  Gastrointestinal: Negative for abdominal pain, nausea and vomiting.  Genitourinary: Negative for difficulty urinating.  Musculoskeletal: Negative for back pain and neck stiffness.  Skin: Negative for rash.  Neurological: Negative for syncope and headaches.     Physical Exam Updated Vital Signs BP (!) 143/78   Pulse 96   Temp 99 F (37.2 C) (Temporal)   Resp (!) 28   SpO2 (!) 89%   Physical Exam  Constitutional: He is oriented to person, place, and time. He appears well-developed and well-nourished. No distress.  HENT:  Head: Normocephalic and atraumatic.  Eyes: Conjunctivae and EOM are normal.  Neck: Normal range of motion.  Cardiovascular: Regular rhythm, normal heart sounds and intact distal pulses. Tachycardia present. Exam reveals no gallop  and no friction rub.  No murmur heard. Pulmonary/Chest: Accessory muscle usage present. Tachypnea noted. He is in respiratory distress. He has wheezes. He has no rales.  Abdominal: Soft. He exhibits no distension. There is no tenderness. There is no guarding.  Musculoskeletal: He exhibits no edema.  Neurological: He is alert and oriented to person, place, and time.  Skin: Skin is warm and dry. He is not diaphoretic.  Nursing note and vitals reviewed.    ED Treatments / Results  Labs (all labs ordered are listed, but only abnormal results are displayed) Labs Reviewed    CBC - Abnormal; Notable for the following components:      Result Value   WBC 14.9 (*)    All other components within normal limits  COMPREHENSIVE METABOLIC PANEL - Abnormal; Notable for the following components:   Glucose, Bld 106 (*)    Total Bilirubin 1.6 (*)    All other components within normal limits  I-STAT TROPONIN, ED    EKG EKG Interpretation  Date/Time:  Monday January 11 2018 11:01:15 EDT Ventricular Rate:  111 PR Interval:    QRS Duration: 109 QT Interval:  354 QTC Calculation: 481 R Axis:   -64 Text Interpretation:  Sinus tachycardia Right atrial enlargement Consider right ventricular hypertrophy Inferior infarct, acute ST elevation, consider anterior injury Lateral leads are also involved Baseline wander in lead(s) V1 V3 Since prior ECG, rate has increased Confirmed by Alvira Monday ([id number]) on 01/11/2018 1:31:53 PM   Radiology Dg Chest Portable 1 View  Result Date: 01/11/2018 CLINICAL DATA:  47 year old male with severe shortness of breath EXAM: PORTABLE CHEST 1 VIEW COMPARISON:  Prior chest x-ray 04/14/2016 FINDINGS: Cardiac and mediastinal contours are within normal limits. The lungs are largely clear save for a focus of patchy airspace opacity in the left lung base. No evidence of pulmonary edema, pleural effusion or pneumothorax. No acute osseous abnormality. IMPRESSION: Focal patchy airspace opacity in the left lung base may reflect atelectasis and/or infiltrate. Otherwise, the lungs are clear. Electronically Signed   By: Malachy Moan M.D.   On: 01/11/2018 11:15    Procedures .Critical Care Performed by: Alvira Monday, MD Authorized by: Alvira Monday, MD   Critical care provider statement:    Critical care time (minutes):  30   Critical care was necessary to treat or prevent imminent or life-threatening deterioration of the following conditions:  Respiratory failure   Critical care was time spent personally by me on the following activities:   Ordering and review of radiographic studies, ordering and review of laboratory studies, ordering and performing treatments and interventions, re-evaluation of patient's condition, pulse oximetry, evaluation of patient's response to treatment and development of treatment plan with patient or surrogate   (including critical care time)  Medications Ordered in ED Medications  albuterol (PROVENTIL,VENTOLIN) solution continuous neb (0 mg/hr Nebulization Stopped 01/11/18 1601)  methylPREDNISolone sodium succinate (SOLU-MEDROL) 125 mg/2 mL injection 125 mg (125 mg Intravenous Given 01/11/18 1105)  magnesium sulfate IVPB 2 g 50 mL (0 g Intravenous Stopped 01/11/18 1230)  cefTRIAXone (ROCEPHIN) 1 g in sodium chloride 0.9 % 100 mL IVPB (0 g Intravenous Stopped 01/11/18 1352)  azithromycin (ZITHROMAX) 500 mg in sodium chloride 0.9 % 250 mL IVPB (0 mg Intravenous Stopped 01/11/18 1305)  ipratropium (ATROVENT) nebulizer solution 0.5 mg (0.5 mg Nebulization Given 01/11/18 1205)     Initial Impression / Assessment and Plan / ED Course  I have reviewed the triage vital signs and the nursing notes.  Pertinent  labs & imaging results that were available during my care of the patient were reviewed by me and considered in my medical decision making (see chart for details).     47 year old male with a history of asthma and smoking presents with concern for cough and shortness of breath.  Patient with respiratory distress on arrival to the.  Initial history and physical exam is concerning for upper respiratory infection with asthma exacerbation.  Given slightly up on exam, and continuous albuterol.  Patient with some improvement following this, continued tachypnea and increased breathing, was given additional continuous albuterol and Atrovent.  X-ray completed shows atelectasis or infiltrate at the left lower base.  Microcytosis present.  Given Rocephin and azithromycin for concern of possible community-acquired pneumonia.   Patient initially with improvement, however then noted to have hypoxia at 83.  Discussed possible option however he declined.  Took patient off of oxygen, and under observation had no desaturations, no increasedwork of breathing, and was ableto ambulate without distress or hypoxia.  Suspect asthma, possible also CAP. Given rx for prednisone and azithromycin, continued use of albuterol at home. Patient discharged in stable condition with understanding of reasons to return.   Final Clinical Impressions(s) / ED Diagnoses   Final diagnoses:  Moderate persistent asthma with exacerbation  Community acquired pneumonia of left lower lobe of lung [facility])    ED Discharge Orders         Ordered    predniSONE (DELTASONE) 10 MG tablet  Daily,   Status:  Discontinued     01/11/18 1547    azithromycin (ZITHROMAX) 250 MG tablet  Daily,   Status:  Discontinued     01/11/18 1547    azithromycin (ZITHROMAX) 250 MG tablet  Daily     01/11/18 1557    predniSONE (DELTASONE) 10 MG tablet  Daily     01/11/18 1557           Alvira Monday, MD 01/11/18 2227

## 2018-01-11 NOTE — ED Notes (Signed)
Pt states improved sob.  Started on 2nd nebx by RT.

## 2018-01-11 NOTE — ED Notes (Signed)
Pt sats remain at 89% on 2L.  MD notified.

## 2018-01-11 NOTE — ED Notes (Signed)
Pt requesting to leave.  Ambulated in hallway.  Sats remained 93%, rr 27 and hr 105.  MD notified.

## 2018-03-09 IMAGING — DX DG CHEST 2V
2 series · 2 of 2 positions shown · non-contrast
Comparison: PA and lateral chest 10/30/2015 and 04/20/2010.

CLINICAL DATA: Wheezing and shortness of breath today.

EXAM:
CHEST  2 VIEW

[chest pa]
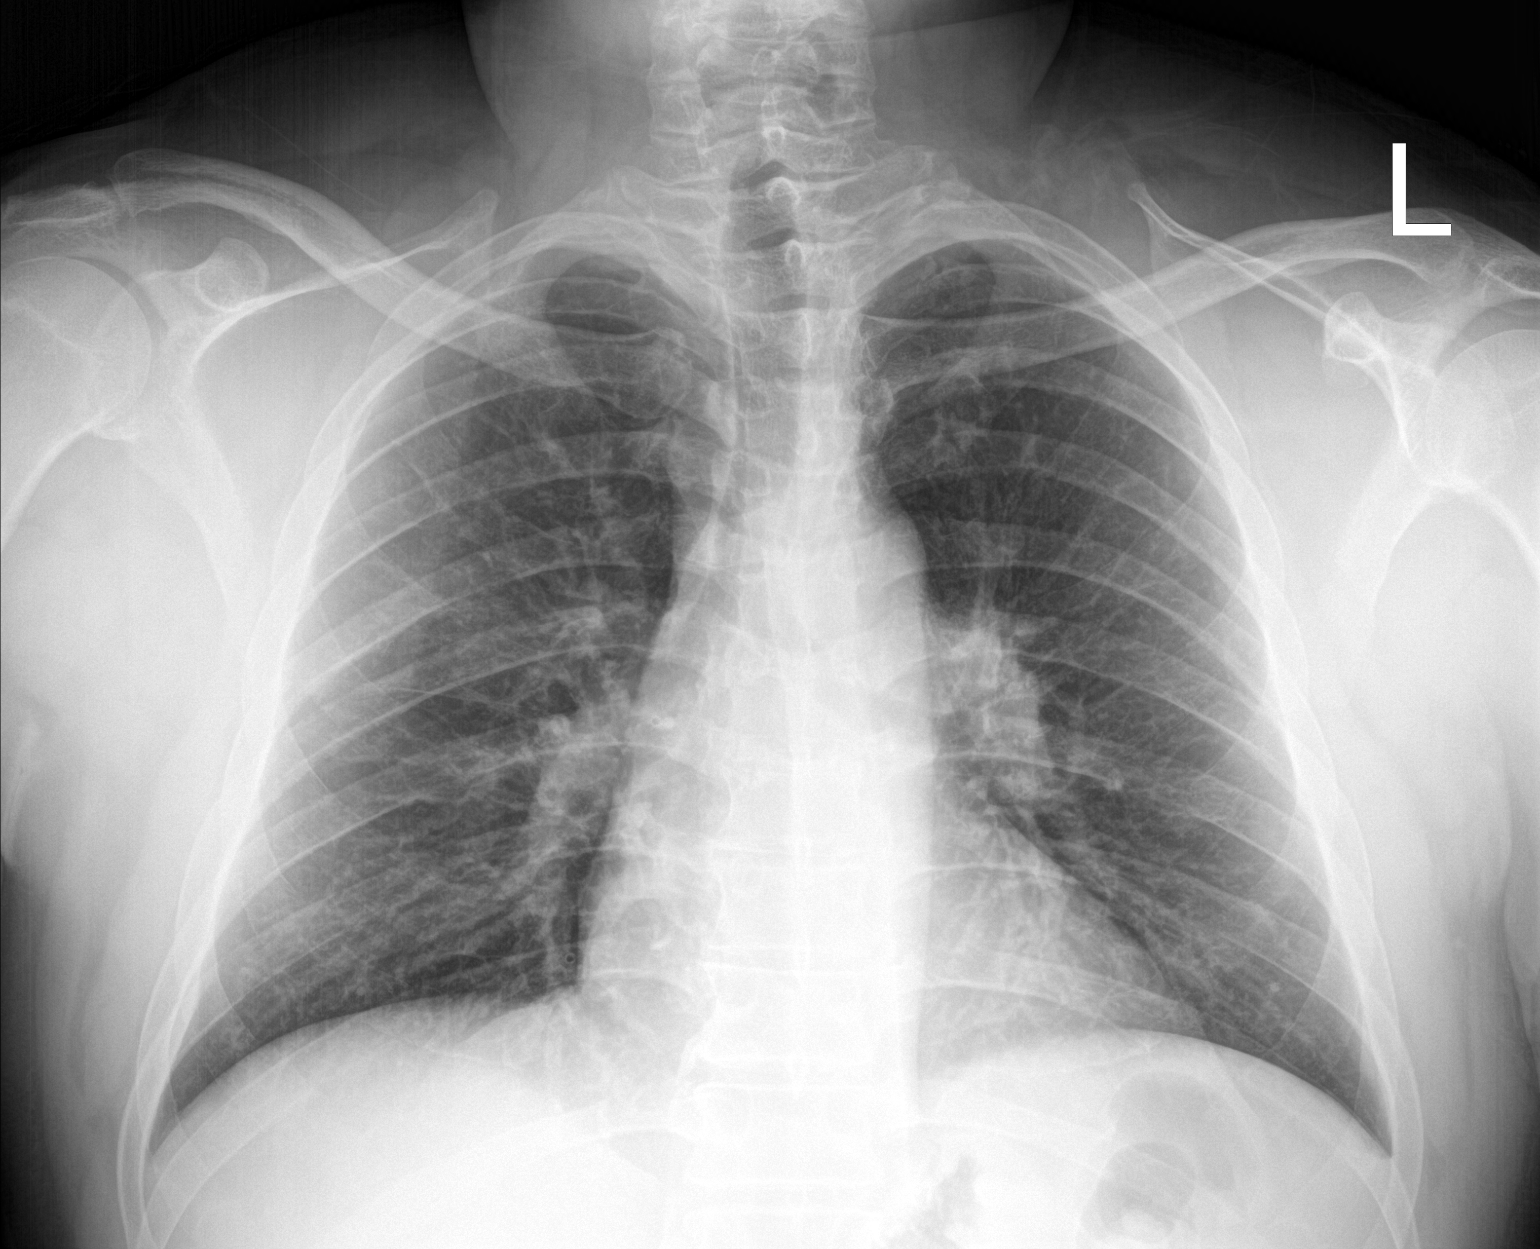

[chest lat]
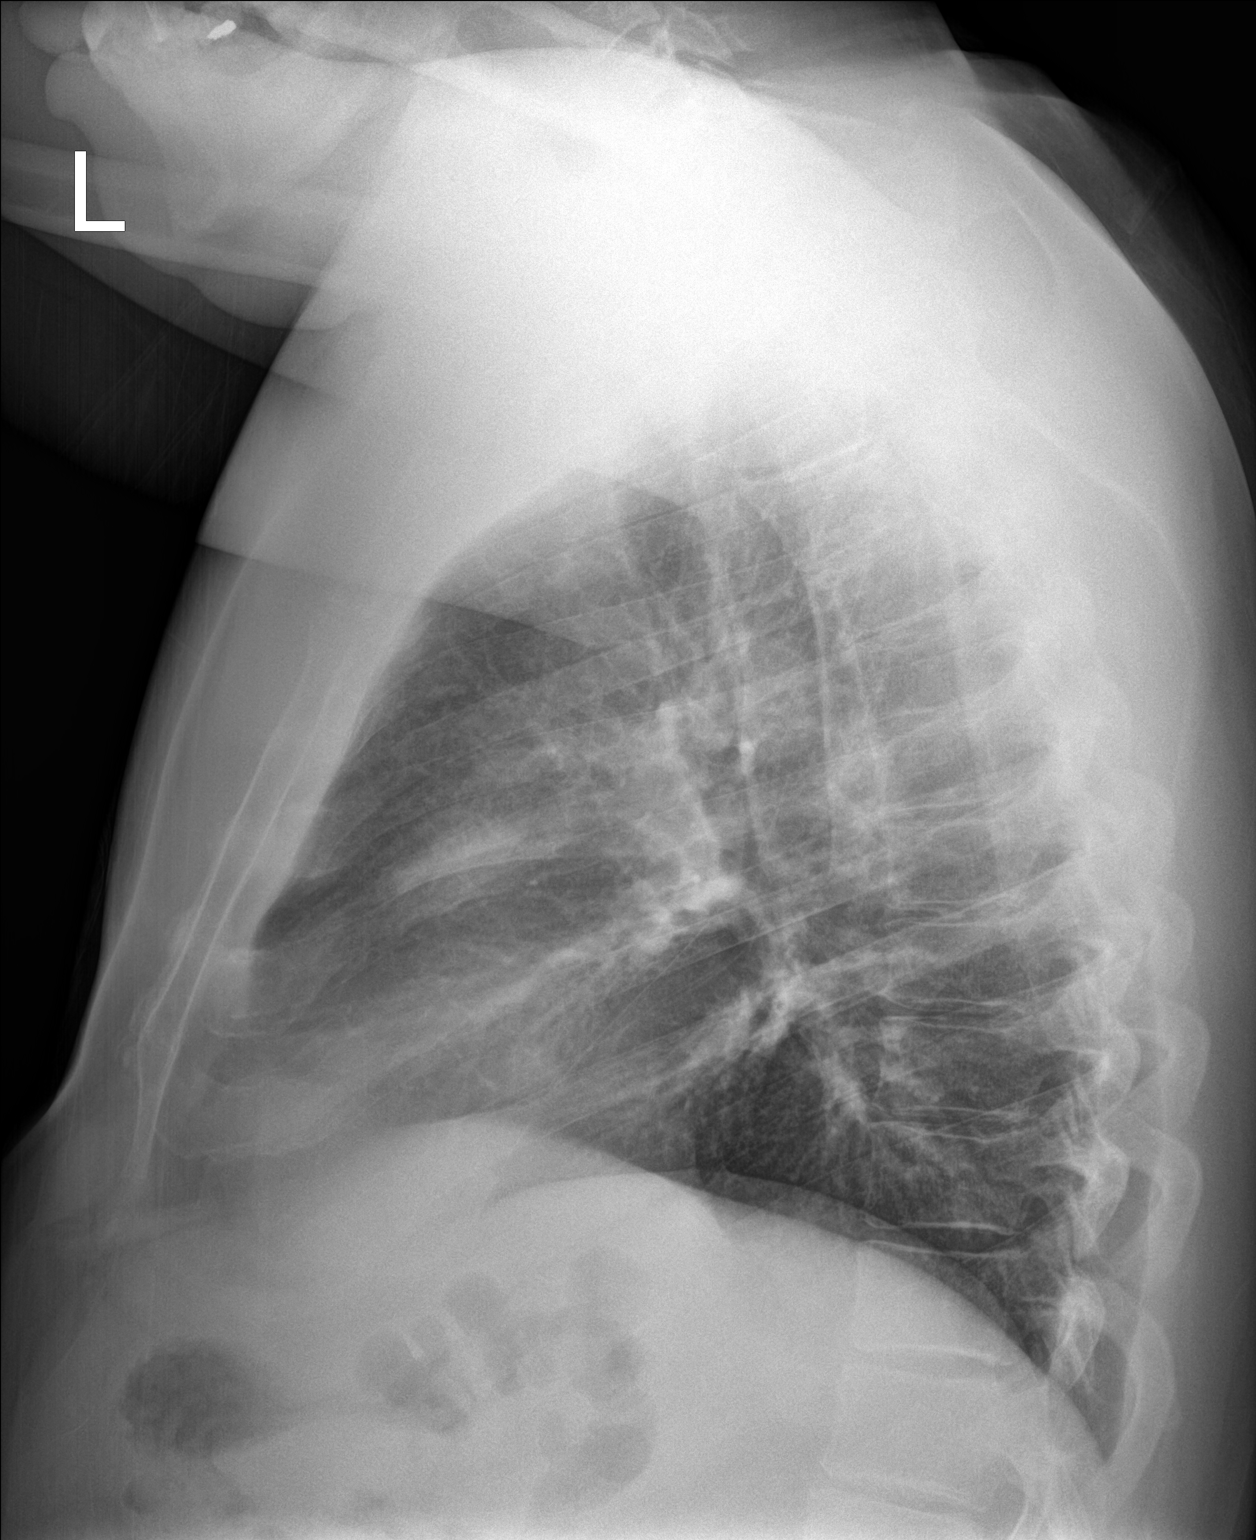

[2 of 2 positions shown; findings below may reference images not displayed]

FINDINGS: There is some peribronchial thickening. No consolidative process,
pneumothorax or effusion. Heart size is normal. No focal bony
abnormality.
IMPRESSION: Bronchitic change.  No focal process.

## 2019-04-23 ENCOUNTER — Ambulatory Visit (HOSPITAL_COMMUNITY)
Admission: EM | Admit: 2019-04-23 | Discharge: 2019-04-23 | Disposition: A | Discharge status: Home / Self Care | Payer: Managed Care, Other (non HMO) | Attending: Emergency Medicine | Admitting: Emergency Medicine

## 2019-04-23 ENCOUNTER — Encounter (HOSPITAL_COMMUNITY): Payer: Self-pay

## 2019-04-23 ENCOUNTER — Other Ambulatory Visit: Payer: Self-pay

## 2019-04-23 DIAGNOSIS — H1032 Unspecified acute conjunctivitis, left eye: Secondary | ICD-10-CM

## 2019-04-23 MED ORDER — TETRACAINE HCL 0.5 % OP SOLN
OPHTHALMIC | Status: AC
  Filled 2019-04-23: qty 4

## 2019-04-23 MED ORDER — POLYMYXIN B-TRIMETHOPRIM 10000-0.1 UNIT/ML-% OP SOLN: 1.0000 [drp] | Freq: Four times a day (QID) | OPHTHALMIC | 0 refills | Status: AC

## 2019-04-23 NOTE — ED Triage Notes (Signed)
Pt present right eye red and eyelids swollen. He woke up this morning with  His red and swollen.

## 2019-04-23 NOTE — Discharge Instructions (Signed)
Conjunctivitis- this is self-limiting and will improve on its own, may worsen for 3-5 days, but should resolve in 10-14 days  - Have Good Hand Hygiene  - Use Cold Compresses  - Artificial Tears 5-6 times a day  - Use Naphcon-A over the counter for redness and itching relief  - Use Polytrim eye drops (2 drops 4 times a day for 5-7 days)

## 2019-04-23 NOTE — ED Provider Notes (Addendum)
[facility]    CSN: [id number] Arrival date & time: 04/23/19  1203      History   Chief Complaint Chief Complaint  Patient presents with  . Conjunctivitis    Left eye     HPI Angel Martin is a 48 y.o. male history of asthma presenting today for evaluation of left eye redness and swelling.  Patient states that yesterday he woke up with some slight redness and mild swelling in his left eye.  States his symptoms persisted throughout the day and overnight.  He has not had any associated pain, irritation, foreign body sensation.  He denies changes in vision.  Denies contact use.  Denies photophobia.  He has used Clear Eyes.  He denies any discharge or drainage.  Denies associated URI symptoms of cough congestion or sore throat.  HPI  Past Medical History:  Diagnosis Date  . Asthma     Patient Active Problem List   Diagnosis Date Noted  . Asthma 10/30/2015  . Smoker 10/30/2015    Past Surgical History:  Procedure Laterality Date  . ACHILLES TENDON REPAIR         Home Medications    Prior to Admission medications   Medication Sig Start Date End Date Taking? Authorizing Provider  albuterol (PROVENTIL HFA;VENTOLIN HFA) 108 (90 Base) MCG/ACT inhaler Inhale 1 puff into the lungs every 6 (six) hours as needed. For wheezing 10/30/15   Tonye Pearsonoolittle, Robert P, MD  azithromycin (ZITHROMAX) 250 MG tablet Take 1 tablet (250 mg total) by mouth daily. Take first 2 tablets together, then 1 every day until finished. 01/11/18   Alvira MondaySchlossman, Erin, MD  beclomethasone (QVAR) 40 MCG/ACT inhaler Inhale 1 puff into the lungs 2 (two) times daily. Patient not taking: Reported on 01/11/2018 04/14/16   Benjiman CoreWiseman, Brittany D, PA-C  montelukast (SINGULAIR) 10 MG tablet Take 10 mg by mouth every morning. 12/16/17   [provider]  trimethoprim-polymyxin b (POLYTRIM) ophthalmic solution Place 1 drop into the left eye every 6 (six) hours for 7 days. 04/23/19 04/30/19  Aleeta Schmaltz, Junius CreamerHallie C, PA-C     Family History History reviewed. No pertinent family history.  Social History Social History   Tobacco Use  . Smoking status: Former Smoker    Packs/day: 0.50    Years: 4.00    Pack years: 2.00    Types: Cigarettes    Quit date: 03/16/2016    Years since quitting: 3.1  Substance Use Topics  . Alcohol use: No  . Drug use: No     Allergies   Patient has no known allergies.   Review of Systems Review of Systems  Constitutional: Negative for activity change, appetite change, chills, fatigue and fever.  HENT: Negative for congestion, ear pain, rhinorrhea, sinus pressure, sore throat and trouble swallowing.   Eyes: Positive for redness. Negative for photophobia, pain, discharge, itching and visual disturbance.  Respiratory: Negative for cough, chest tightness and shortness of breath.   Cardiovascular: Negative for chest pain.  Gastrointestinal: Negative for abdominal pain, diarrhea, nausea and vomiting.  Musculoskeletal: Negative for myalgias.  Skin: Negative for rash.  Neurological: Negative for dizziness, light-headedness and headaches.     Physical Exam Triage Vital Signs ED Triage Vitals  Enc Vitals Group     BP 04/23/19 1323 (!) 159/113     Pulse Rate 04/23/19 1323 73     Resp 04/23/19 1323 16     Temp 04/23/19 1323 98.4 F (36.9 C)     Temp Source 04/23/19 1323  Oral     SpO2 04/23/19 1323 98 %     Weight --      Height --      Head Circumference --      Peak Flow --      Pain Score 04/23/19 1324 0     Pain Loc --      Pain Edu? --      Excl. in GC? --    No data found.  Updated Vital Signs BP (!) 159/113 (BP Location: Right Arm)   Pulse 73   Temp 98.4 F (36.9 C) (Oral)   Resp 16   SpO2 98%   Visual Acuity Right Eye Distance:   Left Eye Distance:   Bilateral Distance:    Right Eye Near:  20/50 Left Eye Near:   20/70 Bilateral Near:   20/50  Physical Exam Vitals and nursing note reviewed.  Constitutional:      Appearance: He is  well-developed.     Comments: No acute distress  HENT:     Head: Normocephalic and atraumatic.     Nose: Nose normal.  Eyes:     Extraocular Movements: Extraocular movements intact.     Pupils: Pupils are equal, round, and reactive to light.     Comments: Left eye: Conjunctival mildly erythematous compared to right, limbus clear, anterior chamber clear, no photophobia with exam; upper and lower lids appear mildly swollen, no overlying erythema or tenderness to touch Small amount of thick discharge present in lateral canthus  Pressure averaging 22 with Tono-Pen, no fluorescein uptake noted  Cardiovascular:     Rate and Rhythm: Normal rate.  Pulmonary:     Effort: Pulmonary effort is normal. No respiratory distress.  Abdominal:     General: There is no distension.  Musculoskeletal:        General: Normal range of motion.     Cervical back: Neck supple.  Skin:    General: Skin is warm and dry.  Neurological:     Mental Status: He is alert and oriented to person, place, and time.      UC Treatments / Results  Labs (all labs ordered are listed, but only abnormal results are displayed) Labs Reviewed - No data to display  EKG   Radiology No results found.  Procedures Procedures (including critical care time)  Medications Ordered in UC Medications - No data to display  Initial Impression / Assessment and Plan / UC Course  I have reviewed the triage vital signs and the nursing notes.  Pertinent labs & imaging results that were available during my care of the patient were reviewed by me and considered in my medical decision making (see chart for details).     Patient appears to have form of conjunctivitis, viral versus bacterial versus allergic.  Given small amount of thick discharge noted we will go ahead and cover for bacterial with Polytrim, also recommending symptomatic and supportive care with warm compresses for swelling, no cervical preseptal or septal cellulitis at  this time.  Continue to monitor,Discussed strict return precautions. Patient verbalized understanding and is agreeable with plan.  Final Clinical Impressions(s) / UC Diagnoses   Final diagnoses:  Acute conjunctivitis of left eye, unspecified acute conjunctivitis type     Discharge Instructions     Conjunctivitis- this is self-limiting and will improve on its own, may worsen for 3-5 days, but should resolve in 10-14 days  - Have Good Hand Hygiene  - Use Cold Compresses  - Artificial Tears  5-6 times a day  - Use Naphcon-A over the counter for redness and itching relief  - Use Polytrim eye drops (2 drops 4 times a day for 5-7 days)      ED Prescriptions    Medication Sig Dispense Auth. Provider   trimethoprim-polymyxin b (POLYTRIM) ophthalmic solution Place 1 drop into the left eye every 6 (six) hours for 7 days. 10 mL Tekoa Hamor, Edgerton C, PA-C     PDMP not reviewed this encounter.   Janith Lima, PA-C 04/23/19 1450    Manasi Dishon, Bertram C, PA-C 04/23/19 1450

## 2019-06-24 ENCOUNTER — Ambulatory Visit (HOSPITAL_COMMUNITY)
Admission: EM | Admit: 2019-06-24 | Discharge: 2019-06-24 | Disposition: A | Discharge status: Home / Self Care | Payer: Managed Care, Other (non HMO)

## 2019-06-24 ENCOUNTER — Encounter (HOSPITAL_COMMUNITY): Payer: Self-pay

## 2019-06-24 ENCOUNTER — Other Ambulatory Visit: Payer: Self-pay

## 2019-06-24 DIAGNOSIS — M25559 Pain in unspecified hip: Secondary | ICD-10-CM

## 2019-06-24 DIAGNOSIS — W19XXXA Unspecified fall, initial encounter: Secondary | ICD-10-CM
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
## 2019-06-24 NOTE — Discharge Instructions (Signed)
This is most likely a bad bruise of the hip. Rest, ice and alternate heat. You can do 600 mg of ibuprofen every 8 hours or Aleve every 12 hours as needed If for any reason this problem continues or worsens please follow-up for x-ray.
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
## 2019-11-15 ENCOUNTER — Other Ambulatory Visit: Payer: Self-pay

## 2019-11-15 ENCOUNTER — Ambulatory Visit (HOSPITAL_COMMUNITY)
Admission: EM | Admit: 2019-11-15 | Discharge: 2019-11-15 | Disposition: A | Discharge status: Home / Self Care | Payer: Managed Care, Other (non HMO) | Attending: Family Medicine | Admitting: Family Medicine

## 2019-11-15 ENCOUNTER — Encounter (HOSPITAL_COMMUNITY): Payer: Self-pay

## 2019-11-15 DIAGNOSIS — M722 Plantar fascial fibromatosis: Secondary | ICD-10-CM | POA: Diagnosis not present

## 2019-11-15 MED ORDER — PREDNISONE 10 MG (21) PO TBPK: ORAL_TABLET | ORAL | 0 refills | Status: DC

## 2019-11-15 NOTE — ED Triage Notes (Signed)
Pt c/o acute right foot pain within the arch of his foot since Saturday; pain now radiating up the shin.   Denies trauma/injury, numbness to toes.  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 Took 800mg  ibuprofen this morning at approx 0800

## 2019-11-15 NOTE — Discharge Instructions (Signed)
Take the medication as prescribed.  Take with food. Recommend insoles for shoes for better support.  Rest, ice to the foot.

## 2019-11-16 NOTE — ED Provider Notes (Signed)
[facility]    CSN: [id number] Arrival date & time: 11/15/19  1210      History   Chief Complaint Chief Complaint  Patient presents with  . Foot Pain    HPI Angel Martin is a 49 y.o. male.   Patient is a 49 year old male that presents today with right foot pain.  This started approximately 4 days ago.  Pain is located to bottom of foot and arch area and radiates into shin at times.  Symptoms have been constant.  Worse when he is walking.  Does a lot of walking and standing at work with boots and concrete floors.  Has been taking ibuprofen with some relief.  No injuries.  ROS per HPI      Past Medical History:  Diagnosis Date  . Asthma     Patient Active Problem List   Diagnosis Date Noted  . Asthma 10/30/2015  . Smoker 10/30/2015    Past Surgical History:  Procedure Laterality Date  . ACHILLES TENDON REPAIR         Home Medications    Prior to Admission medications   Medication Sig Start Date End Date Taking? Authorizing Provider  albuterol (PROVENTIL HFA;VENTOLIN HFA) 108 (90 Base) MCG/ACT inhaler Inhale 1 puff into the lungs every 6 (six) hours as needed. For wheezing 10/30/15  Yes Tonye Pearson, MD  predniSONE (STERAPRED UNI-PAK 21 TAB) 10 MG (21) TBPK tablet 6 tabs for 1 day, then 5 tabs for 1 das, then 4 tabs for 1 day, then 3 tabs for 1 day, 2 tabs for 1 day, then 1 tab for 1 day 11/15/19   Dahlia Byes A, NP  beclomethasone (QVAR) 40 MCG/ACT inhaler Inhale 1 puff into the lungs 2 (two) times daily. Patient not taking: Reported on 01/11/2018 04/14/16 06/24/19  Benjiman Core D, PA-C  montelukast (SINGULAIR) 10 MG tablet Take 10 mg by mouth every morning. 12/16/17 06/24/19  [provider]    Family History History reviewed. No pertinent family history.  Social History Social History   Tobacco Use  . Smoking status: Former Smoker    Packs/day: 0.50    Years: 4.00    Pack years: 2.00    Types: Cigarettes    Quit date: 03/16/2016      Years since quitting: 3.6  . Smokeless tobacco: Never Used  Vaping Use  . Vaping Use: Never used  Substance Use Topics  . Alcohol use: No  . Drug use: No     Allergies   Shellfish allergy   Review of Systems Review of Systems   Physical Exam Triage Vital Signs ED Triage Vitals  Enc Vitals Group     BP 11/15/19 1352 (!) 165/107     Pulse Rate 11/15/19 1352 73     Resp 11/15/19 1352 16     Temp 11/15/19 1352 98.4 F (36.9 C)     Temp Source 11/15/19 1352 Oral     SpO2 11/15/19 1352 98 %     Weight --      Height --      Head Circumference --      Peak Flow --      Pain Score 11/15/19 1349 5     Pain Loc --      Pain Edu? --      Excl. in GC? --    No data found.  Updated Vital Signs BP (!) 165/107 (BP Location: Right Arm)   Pulse 73   Temp 98.4 F (  36.9 C) (Oral)   Resp 16   SpO2 98%   Visual Acuity Right Eye Distance:   Left Eye Distance:   Bilateral Distance:    Right Eye Near:   Left Eye Near:    Bilateral Near:     Physical Exam Vitals and nursing note reviewed.  Constitutional:      Appearance: Normal appearance.  HENT:     Head: Normocephalic and atraumatic.     Nose: Nose normal.  Eyes:     Conjunctiva/sclera: Conjunctivae normal.  Pulmonary:     Effort: Pulmonary effort is normal.  Musculoskeletal:        General: Normal range of motion.     Cervical back: Normal range of motion.       Feet:  Feet:     Comments: TTP No swelling, bruising or deformities. 2+ pulse pedal Normal ROM  Skin:    General: Skin is warm and dry.  Neurological:     Mental Status: He is alert.  Psychiatric:        Mood and Affect: Mood normal.      UC Treatments / Results  Labs (all labs ordered are listed, but only abnormal results are displayed) Labs Reviewed - No data to display  EKG   Radiology No results found.  Procedures Procedures (including critical care time)  Medications Ordered in UC Medications - No data to  display  Initial Impression / Assessment and Plan / UC Course  I have reviewed the triage vital signs and the nursing notes.  Pertinent labs & imaging results that were available during my care of the patient were reviewed by me and considered in my medical decision making (see chart for details).     Plantar fasciitis Treating with prednisone.  Recommended insoles for shoes for better support. Rest, ice, elevate Follow up as needed for continued or worsening symptoms  Final Clinical Impressions(s) / UC Diagnoses   Final diagnoses:  Plantar fasciitis of right foot     Discharge Instructions     Take the medication as prescribed.  Take with food. Recommend insoles for shoes for better support.  Rest, ice to the foot.    ED Prescriptions    Medication Sig Dispense Auth. Provider   predniSONE (STERAPRED UNI-PAK 21 TAB) 10 MG (21) TBPK tablet 6 tabs for 1 day, then 5 tabs for 1 das, then 4 tabs for 1 day, then 3 tabs for 1 day, 2 tabs for 1 day, then 1 tab for 1 day 21 tablet Jaimes Eckert A, NP     PDMP not reviewed this encounter.   Dahlia Byes A, NP 11/16/19 1353

## 2019-12-06 IMAGING — DX DG CHEST 1V PORT
1 series · 1 of 1 positions shown · non-contrast
Comparison: Prior chest x-ray 04/14/2016

CLINICAL DATA: 47-year-old male with severe shortness of breath

EXAM:
PORTABLE CHEST 1 VIEW

[chest ap]
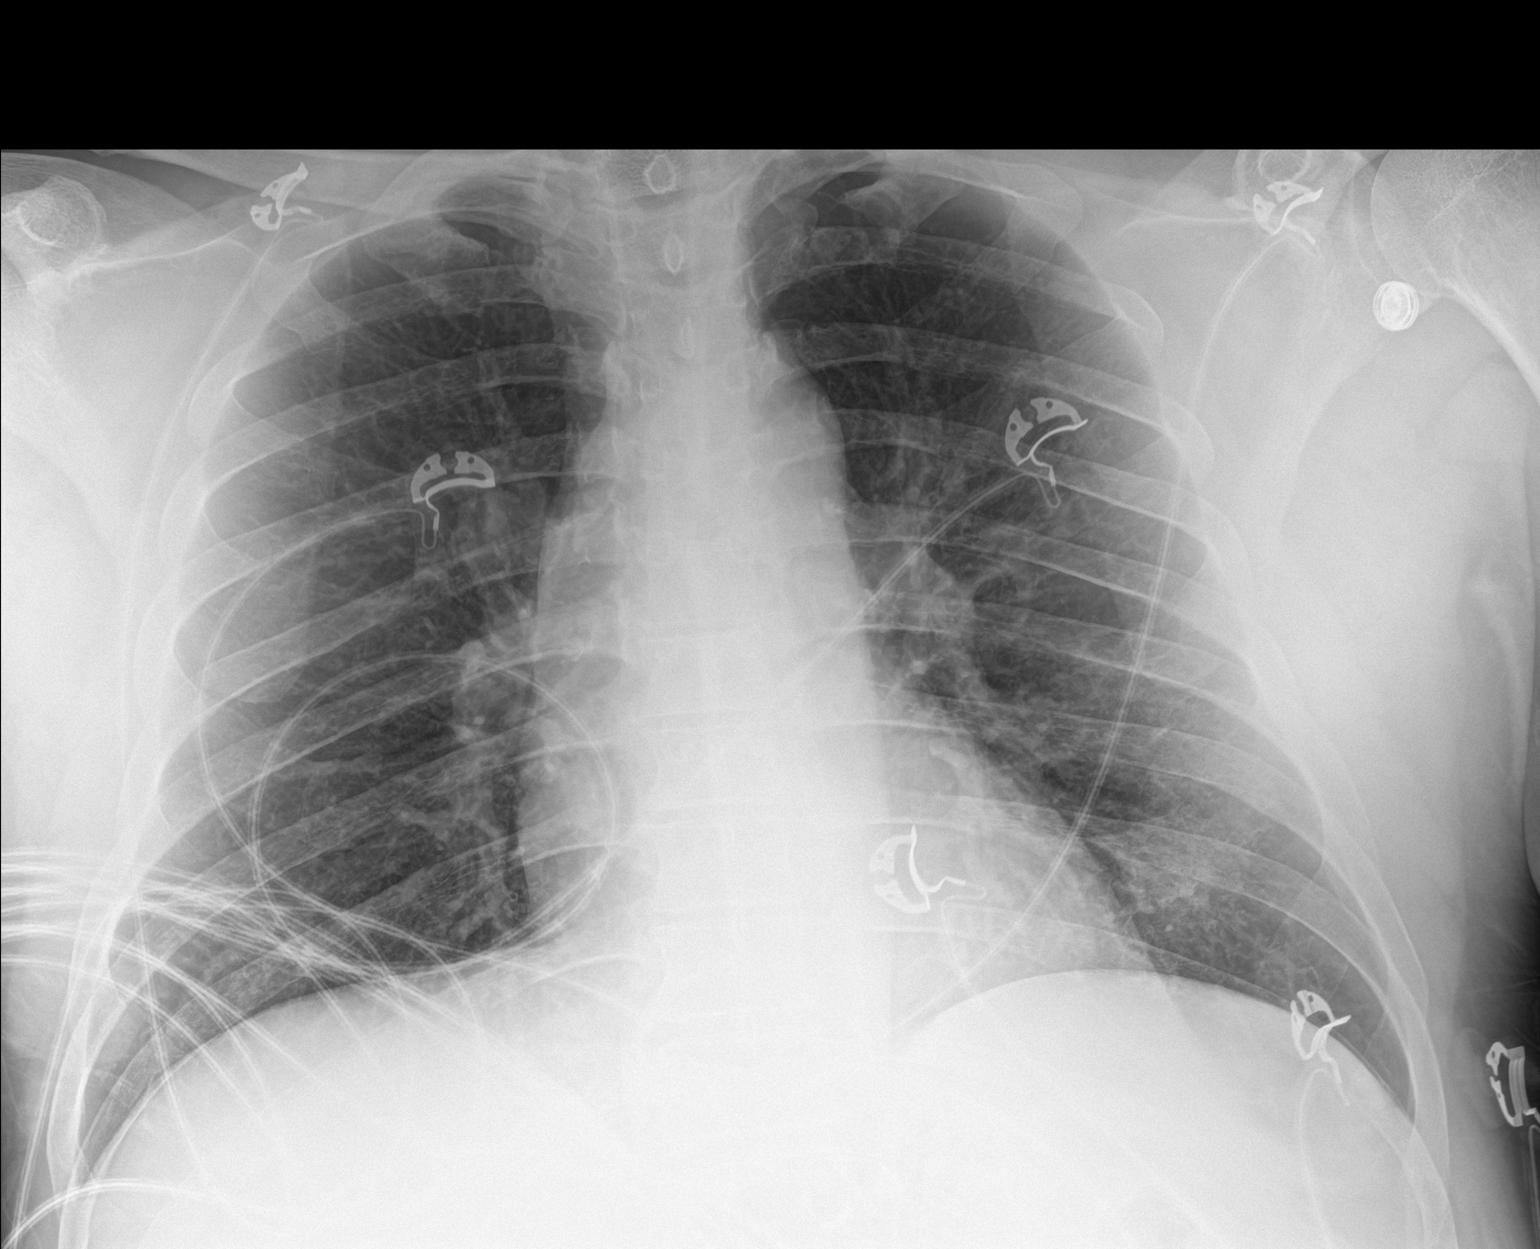

[1 of 1 positions shown; findings below may reference images not displayed]

FINDINGS: Cardiac and mediastinal contours are within normal limits. The lungs
are largely clear save for a focus of patchy airspace opacity in the
left lung base. No evidence of pulmonary edema, pleural effusion or
pneumothorax. No acute osseous abnormality.
IMPRESSION: Focal patchy airspace opacity in the left lung base may reflect
atelectasis and/or infiltrate.

Otherwise, the lungs are clear.

## 2020-04-17 ENCOUNTER — Ambulatory Visit (HOSPITAL_COMMUNITY)
Admission: EM | Admit: 2020-04-17 | Discharge: 2020-04-17 | Disposition: A | Discharge status: Home / Self Care | Payer: Managed Care, Other (non HMO)

## 2020-04-17 ENCOUNTER — Encounter (HOSPITAL_COMMUNITY): Payer: Self-pay

## 2020-04-17 DIAGNOSIS — H15002 Unspecified scleritis, left eye: Secondary | ICD-10-CM

## 2020-04-17 MED ORDER — TOBRAMYCIN-DEXAMETHASONE 0.3-0.1 % OP SUSP: 2.0000 [drp] | OPHTHALMIC | 0 refills | Status: DC

## 2020-04-17 MED ORDER — FLUORESCEIN SODIUM 1 MG OP STRP
ORAL_STRIP | OPHTHALMIC | Status: AC
  Filled 2020-04-17: qty 1

## 2020-04-17 NOTE — ED Provider Notes (Signed)
[facility]    CSN: [id number] Arrival date & time: 04/17/20  3810      History   Chief Complaint Chief Complaint  Patient presents with  . Eye Problem    HPI Angel Martin is a 49 y.o. male.   Angel Martin presents with complaints of left eye pain, redness and photophobia. Has been worsening over the past 4 days. Complaints of headache behind eye. No vision changes. No significant tearing or mattering. He has been using over the counter eye drops which haven't helped. No injury. No lid swelling. No known exposure to the eye. Denies any previous similar. Wears glasses, doesn't wear contacts. He has an appointment with his PCP in two days.     ROS per HPI, negative if not otherwise mentioned.       Past Medical History:  Diagnosis Date  . Asthma     Patient Active Problem List   Diagnosis Date Noted  . Asthma 10/30/2015  . Smoker 10/30/2015    Past Surgical History:  Procedure Laterality Date  . ACHILLES TENDON REPAIR         Home Medications    Prior to Admission medications   Medication Sig Start Date End Date Taking? Authorizing Provider  albuterol (PROVENTIL HFA;VENTOLIN HFA) 108 (90 Base) MCG/ACT inhaler Inhale 1 puff into the lungs every 6 (six) hours as needed. For wheezing 10/30/15  Yes Tonye Pearson, MD  montelukast (SINGULAIR) 10 MG tablet SMARTSIG:1 Tablet(s) By Mouth Every Evening PRN 03/19/20  Yes [provider]  predniSONE (STERAPRED UNI-PAK 21 TAB) 10 MG (21) TBPK tablet 6 tabs for 1 day, then 5 tabs for 1 das, then 4 tabs for 1 day, then 3 tabs for 1 day, 2 tabs for 1 day, then 1 tab for 1 day 11/15/19   Dahlia Byes A, NP  tobramycin-dexamethasone (TOBRADEX) ophthalmic solution Place 2 drops into the left eye every 4 (four) hours while awake. 04/17/20   Georgetta Haber, NP  beclomethasone (QVAR) 40 MCG/ACT inhaler Inhale 1 puff into the lungs 2 (two) times daily. Patient not taking: Reported on 01/11/2018 04/14/16 06/24/19   Magdalene River, PA-C    Family History History reviewed. No pertinent family history.  Social History Social History   Tobacco Use  . Smoking status: Former Smoker    Packs/day: 0.50    Years: 4.00    Pack years: 2.00    Types: Cigarettes    Quit date: 03/16/2016    Years since quitting: 4.0  . Smokeless tobacco: Never Used  Vaping Use  . Vaping Use: Never used  Substance Use Topics  . Alcohol use: No  . Drug use: No     Allergies   Shellfish allergy   Review of Systems Review of Systems   Physical Exam Triage Vital Signs ED Triage Vitals  Enc Vitals Group     BP 04/17/20 1913 (!) 169/117     Pulse Rate 04/17/20 1913 89     Resp 04/17/20 1913 18     Temp 04/17/20 1913 98.3 F (36.8 C)     Temp Source 04/17/20 1913 Oral     SpO2 04/17/20 1913 96 %     Weight --      Height --      Head Circumference --      Peak Flow --      Pain Score 04/17/20 1909 9     Pain Loc --      Pain Edu? --  Excl. in [place]? --    No data found.  Updated Vital Signs BP (!) 169/117 (BP Location: Left Wrist)   Pulse 89   Temp 98.3 F (36.8 C) (Oral)   Resp 18   SpO2 96%   Visual Acuity Right Eye Distance: 20/30 Left Eye Distance: 20/50 Bilateral Distance: 20/30  Right Eye Near:   Left Eye Near:    Bilateral Near:     Physical Exam Constitutional:      Appearance: He is well-developed.  Eyes:     General: Lids are normal. Vision grossly intact. Gaze aligned appropriately.        Right eye: No foreign body.        Left eye: No foreign body.     Intraocular pressure: Right eye pressure is 17 mmHg. Left eye pressure is 21 mmHg.     Extraocular Movements: Extraocular movements intact.     Pupils: Pupils are equal, round, and reactive to light.     Left eye: No corneal abrasion or fluorescein uptake.     Slit lamp exam:    Left eye: No corneal ulcer.     Comments: Small amount of discharge noted to left inner eye; bilateral eyes are red with left eye being  darker red; pain with EOM to left eye; no red ring to iris noted; no acute findings on fluorescein exam   Cardiovascular:     Rate and Rhythm: Normal rate.  Pulmonary:     Effort: Pulmonary effort is normal.  Skin:    General: Skin is warm and dry.  Neurological:     Mental Status: He is alert and oriented to person, place, and time.      UC Treatments / Results  Labs (all labs ordered are listed, but only abnormal results are displayed) Labs Reviewed - No data to display  EKG   Radiology No results found.  Procedures Procedures (including critical care time)  Medications Ordered in UC Medications - No data to display  Initial Impression / Assessment and Plan / UC Course  I have reviewed the triage vital signs and the nursing notes.  Pertinent labs & imaging results that were available during my care of the patient were reviewed by me and considered in my medical decision making (see chart for details).     Concern for scleritis to left eye. tobradex provided and emphsized need to follow up with ophthalmology tomorrow. Elevated BP,this is not new for him, per chart review, has appointment with PCP in two days. Patient verbalized understanding and agreeable to plan.   Final Clinical Impressions(s) / UC Diagnoses   Final diagnoses:  Scleritis of left eye     Discharge Instructions     Use of drops as provided. Call tomorrow to dr. Chase Caller office, or your eye doctor's office, to be seen tomorrow for further evaluation and treatment tomorrow    ED Prescriptions    Medication Sig Dispense Auth. Provider   tobramycin-dexamethasone [facility]) ophthalmic solution Place 2 drops into the left eye every 4 (four) hours while awake. 5 mL Georgetta Haber, NP     PDMP not reviewed this encounter.   Georgetta Haber, NP 04/17/20 2034

## 2020-04-17 NOTE — Discharge Instructions (Signed)
Use of drops as provided. Call tomorrow to dr. Chase Caller office, or your eye doctor's office, to be seen tomorrow for further evaluation and treatment tomorrow

## 2020-04-17 NOTE — ED Triage Notes (Signed)
Pt c/o left eye irritation onset Friday, no improvement with OTC "dry eye" drops. Also reports congestion for approx 3 weeks.  Denies drainage from eye, cough, sore throat, ear pain, runny nose, SOB, body aches, fever. Took ibuprofen yesterday.  Left eye with erythema, tearing.  Denies known trauma to left eye.

## 2020-10-25 ENCOUNTER — Encounter (HOSPITAL_COMMUNITY): Payer: Self-pay | Admitting: Emergency Medicine

## 2020-10-25 ENCOUNTER — Ambulatory Visit (HOSPITAL_COMMUNITY)
Admission: EM | Admit: 2020-10-25 | Discharge: 2020-10-25 | Disposition: A | Discharge status: Home / Self Care | Payer: 59

## 2020-10-25 ENCOUNTER — Other Ambulatory Visit: Payer: Self-pay

## 2020-10-25 DIAGNOSIS — I10 Essential (primary) hypertension: Secondary | ICD-10-CM

## 2020-10-25 DIAGNOSIS — M25552 Pain in left hip: Secondary | ICD-10-CM | POA: Diagnosis not present

## 2020-10-25 MED ORDER — MELOXICAM 7.5 MG PO TABS: 7.5000 mg | ORAL_TABLET | Freq: Every day | ORAL | 0 refills | Status: AC

## 2020-10-25 NOTE — ED Triage Notes (Signed)
Recently started a new job involving going up and down steps and old injury has started hurting him.  Left hip pain.  This pain is non-radiating.

## 2020-10-25 NOTE — ED Provider Notes (Signed)
[facility]    CSN: [id number] Arrival date & time: 10/25/20  0843      History   Chief Complaint Chief Complaint  Patient presents with   Hip Pain    HPI Angel Martin is a 50 y.o. male.   Patient here for evaluation of left hip pain that has started since he started a job that requires frequent walking up and down stairs.  Reports fell approximately a year ago and was evaluated but nothing was broken at that time.  Denies any recent injuries.  Reports taking OTC medications with minimal relief.  Pain is achy and worse with movement.  Denies any specific alleviating.  Denies any fevers, chest pain, shortness of breath, N/V/D, numbness, tingling, weakness, abdominal pain, or headaches.     The history is provided by the patient.  Hip Pain   Past Medical History:  Diagnosis Date   Asthma     Patient Active Problem List   Diagnosis Date Noted   Asthma 10/30/2015   Smoker 10/30/2015    Past Surgical History:  Procedure Laterality Date   ACHILLES TENDON REPAIR         Home Medications    Prior to Admission medications   Medication Sig Start Date End Date Taking? Authorizing Provider  amLODipine (NORVASC) 5 MG tablet Take 1 tablet by mouth daily. 08/17/20  Yes [provider]  fluticasone furoate-vilanterol (BREO ELLIPTA) 100-25 MCG/INH AEPB 1 puff 11/30/18  Yes [provider]  lisinopril-hydrochlorothiazide (ZESTORETIC) 20-12.5 MG tablet Take 1 tablet by mouth daily. 08/17/20  Yes [provider]  meloxicam (MOBIC) 7.5 MG tablet Take 1 tablet (7.5 mg total) by mouth daily for 15 days. 10/25/20 11/09/20 Yes Ivette Loyal, NP  albuterol (PROVENTIL HFA;VENTOLIN HFA) 108 (90 Base) MCG/ACT inhaler Inhale 1 puff into the lungs every 6 (six) hours as needed. For wheezing 10/30/15   Tonye Pearson, MD  montelukast (SINGULAIR) 10 MG tablet SMARTSIG:1 Tablet(s) By Mouth Every Evening PRN 03/19/20   [provider]  predniSONE  (STERAPRED UNI-PAK 21 TAB) 10 MG (21) TBPK tablet 6 tabs for 1 day, then 5 tabs for 1 das, then 4 tabs for 1 day, then 3 tabs for 1 day, 2 tabs for 1 day, then 1 tab for 1 day Patient not taking: Reported on 10/25/2020 11/15/19   Janace Aris, NP  tobramycin-dexamethasone (TOBRADEX) ophthalmic solution Place 2 drops into the left eye every 4 (four) hours while awake. Patient not taking: Reported on 10/25/2020 04/17/20   Linus Mako B, NP  beclomethasone (QVAR) 40 MCG/ACT inhaler Inhale 1 puff into the lungs 2 (two) times daily. Patient not taking: Reported on 01/11/2018 04/14/16 06/24/19  Magdalene River, PA-C    Family History History reviewed. No pertinent family history.  Social History Social History   Tobacco Use   Smoking status: Former    Packs/day: 0.50    Years: 4.00    Pack years: 2.00    Types: Cigarettes    Quit date: 03/16/2016    Years since quitting: 4.6   Smokeless tobacco: Never  Vaping Use   Vaping Use: Never used  Substance Use Topics   Alcohol use: No   Drug use: No     Allergies   Shellfish allergy   Review of Systems Review of Systems  Musculoskeletal:  Positive for arthralgias and joint swelling. Negative for back pain and gait problem.  All other systems reviewed and are negative.   Physical Exam Triage  Vital Signs ED Triage Vitals  Enc Vitals Group     BP 10/25/20 0902 (!) 164/119     Pulse Rate 10/25/20 0902 83     Resp 10/25/20 0902 (!) 22     Temp 10/25/20 0904 99.1 F (37.3 C)     Temp Source 10/25/20 0902 Oral     SpO2 10/25/20 0902 95 %     Weight --      Height --      Head Circumference --      Peak Flow --      Pain Score 10/25/20 0856 7     Pain Loc --      Pain Edu? --      Excl. in GC? --    No data found.  Updated Vital Signs BP (!) 163/115 (BP Location: Right Arm) Comment (BP Location): re-check of blood pressure.  may have taken blood pressure medicine last week  Pulse 83   Temp 99.1 F (37.3 C) (Oral)   Resp  (!) 24   SpO2 95%   Visual Acuity Right Eye Distance:   Left Eye Distance:   Bilateral Distance:    Right Eye Near:   Left Eye Near:    Bilateral Near:     Physical Exam Vitals and nursing note reviewed.  Constitutional:      General: He is not in acute distress.    Appearance: Normal appearance. He is not ill-appearing, toxic-appearing or diaphoretic.  HENT:     Head: Normocephalic and atraumatic.  Eyes:     Conjunctiva/sclera: Conjunctivae normal.  Cardiovascular:     Rate and Rhythm: Normal rate.     Pulses: Normal pulses.  Pulmonary:     Effort: Pulmonary effort is normal.  Abdominal:     General: Abdomen is flat.  Musculoskeletal:     Cervical back: Normal range of motion.     Left hip: Tenderness and bony tenderness present. No deformity, lacerations or crepitus. Decreased range of motion (decreased active ROM due to pain, but full passive ROM). Normal strength.  Skin:    General: Skin is warm and dry.  Neurological:     General: No focal deficit present.     Mental Status: He is alert and oriented to person, place, and time.  Psychiatric:        Mood and Affect: Mood normal.     UC Treatments / Results  Labs (all labs ordered are listed, but only abnormal results are displayed) Labs Reviewed - No data to display  EKG   Radiology No results found.  Procedures Procedures (including critical care time)  Medications Ordered in UC Medications - No data to display  Initial Impression / Assessment and Plan / UC Course  I have reviewed the triage vital signs and the nursing notes.  Pertinent labs & imaging results that were available during my care of the patient were reviewed by me and considered in my medical decision making (see chart for details).    Assessment negative for red flags or concerns.  Hip pain differentials include arthritis or tendinitis.  We will treat with meloxicam daily for the next 7 days.  May take Tylenol for any breakthrough  pain.  Recommend rest, ice/heat, and elevation.  May use IcyHot or pain creams for comfort.  Instructed patient to follow-up with primary care or orthopedics if symptoms have not improved in the next week.  Patient was also instructed to take blood pressure medications daily as prescribed.  Verbalized  understanding. Final Clinical Impressions(s) / UC Diagnoses   Final diagnoses:  Left hip pain  Hypertension, unspecified type     Discharge Instructions      Take the meloxicam daily for the next 7 days.  You can also take Tylenol to help with any pain.    Ice for 10-15 minutes every 4-6 hours as needed for pain and swelling.  You can also apply heat or alternate between ice and heat.  Elevate above your hip/heart when sitting and laying down  You can use Icy/hot or pain creams for comfort.    TAKE YOUR BLOOD PRESSURE MEDICATION AS PRESCRIBED.   Follow up with your primary care or orthopedics if symptoms worsen or do not improve in the next week.      ED Prescriptions     Medication Sig Dispense Auth. Provider   meloxicam (MOBIC) 7.5 MG tablet Take 1 tablet (7.5 mg total) by mouth daily for 15 days. 15 tablet Ivette Loyal, NP      PDMP not reviewed this encounter.   Ivette Loyal, NP 10/25/20 [phone]

## 2020-10-25 NOTE — ED Notes (Signed)
Patient left in take to find inhaler and ipod items he thinks he dropped

## 2020-10-25 NOTE — Discharge Instructions (Addendum)
Take the meloxicam daily for the next 7 days.  You can also take Tylenol to help with any pain.    Ice for 10-15 minutes every 4-6 hours as needed for pain and swelling.  You can also apply heat or alternate between ice and heat.  Elevate above your hip/heart when sitting and laying down  You can use Icy/hot or pain creams for comfort.    TAKE YOUR BLOOD PRESSURE MEDICATION AS PRESCRIBED.   Follow up with your primary care or orthopedics if symptoms worsen or do not improve in the next week.

## 2022-06-29 ENCOUNTER — Ambulatory Visit (HOSPITAL_COMMUNITY)
Admission: EM | Admit: 2022-06-29 | Discharge: 2022-06-29 | Disposition: A | Discharge status: Home / Self Care | Payer: Managed Care, Other (non HMO) | Attending: Physician Assistant | Admitting: Physician Assistant

## 2022-06-29 ENCOUNTER — Other Ambulatory Visit: Payer: Self-pay

## 2022-06-29 ENCOUNTER — Encounter (HOSPITAL_COMMUNITY): Payer: Self-pay | Admitting: *Deleted

## 2022-06-29 DIAGNOSIS — K529 Noninfective gastroenteritis and colitis, unspecified: Secondary | ICD-10-CM

## 2022-06-29 DIAGNOSIS — R112 Nausea with vomiting, unspecified: Secondary | ICD-10-CM

## 2022-06-29 HISTORY — DX: Essential (primary) hypertension: I10

## 2022-06-29 MED ORDER — ONDANSETRON 4 MG PO TBDP
4.0000 mg | ORAL_TABLET | Freq: Once | ORAL | Status: AC
  Administered 2022-06-29: 4 mg via ORAL

## 2022-06-29 MED ORDER — ONDANSETRON 4 MG PO TBDP: 4.0000 mg | ORAL_TABLET | Freq: Once | ORAL | Status: DC

## 2022-06-29 MED ORDER — ONDANSETRON HCL 4 MG PO TABS: 4.0000 mg | ORAL_TABLET | Freq: Three times a day (TID) | ORAL | 0 refills | Status: DC | PRN

## 2022-06-29 MED ORDER — ONDANSETRON 4 MG PO TBDP
ORAL_TABLET | ORAL | Status: AC
  Filled 2022-06-29: qty 1

## 2022-06-29 NOTE — Discharge Instructions (Addendum)
Advised take Zofran 4 mg every 6 hours on a regular basis for the next couple days until symptoms resolved. Advised to follow bland diet and avoid fried, spicy, greasy foods for the next couple days. Advised to make sure that she drink clear liquids over the next couple days and avoid dark-colored drinks.  Advised follow-up PCP or return to urgent care as needed.

## 2022-06-29 NOTE — ED Provider Notes (Signed)
[place]    CSN: [id number] Arrival date & time: 06/29/22  1141      History   Chief Complaint Chief Complaint  Patient presents with   Abdominal Pain   Vomiting    HPI Angel Martin is a 52 y.o. male.   60-year-old male presents with nausea vomiting and diarrhea.  Patient indicates that yesterday after work he got home and ate some cube steak.  He then ate some leftover steak lo mein that was in the fridge rater for couple days.  He indicates that about 2:00 this morning he started having stomach cramping, nausea, and repeated vomiting.  He indicates that this went on throughout the early morning hours, and then started having diarrhea and loose stools.  Patient indicates that he was on [address] he had not will work when he started having again episodes of vomiting but he was dry heaving.  Patient indicates since he is still having stomach cramping, fatigue, without fever or chills.  He does indicate that stomach is sore from having so many vomiting episodes.  Patient indicates that one of his coworkers he works close to had a stomach virus however he is only in close contact with him intermittently.  He is tolerating fluids well.   Abdominal Pain   Past Medical History:  Diagnosis Date   Asthma    Hypertension     Patient Active Problem List   Diagnosis Date Noted   Asthma 10/30/2015   Smoker 10/30/2015    Past Surgical History:  Procedure Laterality Date   ACHILLES TENDON REPAIR         Home Medications    Prior to Admission medications   Medication Sig Start Date End Date Taking? Authorizing Provider  albuterol (PROVENTIL HFA;VENTOLIN HFA) 108 (90 Base) MCG/ACT inhaler Inhale 1 puff into the lungs every 6 (six) hours as needed. For wheezing 10/30/15  Yes Leandrew Koyanagi, MD  ondansetron (ZOFRAN) 4 MG tablet Take 1 tablet (4 mg total) by mouth every 8 (eight) hours as needed for nausea or vomiting. 06/29/22  Yes Nyoka Lint, PA-C  amLODipine  (NORVASC) 5 MG tablet Take 1 tablet by mouth daily. 08/17/20   [provider]  fluticasone furoate-vilanterol (BREO ELLIPTA) 100-25 MCG/INH AEPB 1 puff 11/30/18   [provider]  lisinopril-hydrochlorothiazide (ZESTORETIC) 20-12.5 MG tablet Take 1 tablet by mouth daily. 08/17/20   [provider]  montelukast (SINGULAIR) 10 MG tablet SMARTSIG:1 Tablet(s) By Mouth Every Evening PRN 03/19/20   [provider]  predniSONE (STERAPRED UNI-PAK 21 TAB) 10 MG (21) TBPK tablet 6 tabs for 1 day, then 5 tabs for 1 das, then 4 tabs for 1 day, then 3 tabs for 1 day, 2 tabs for 1 day, then 1 tab for 1 day Patient not taking: Reported on 10/25/2020 11/15/19   Loura Halt A, NP  tobramycin-dexamethasone (TOBRADEX) ophthalmic solution Place 2 drops into the left eye every 4 (four) hours while awake. Patient not taking: Reported on 10/25/2020 04/17/20   Augusto Gamble B, NP  beclomethasone (QVAR) 40 MCG/ACT inhaler Inhale 1 puff into the lungs 2 (two) times daily. Patient not taking: Reported on 01/11/2018 04/14/16 06/24/19  Leonie Douglas, PA-C    Family History History reviewed. No pertinent family history.  Social History Social History   Tobacco Use   Smoking status: Every Day    Packs/day: 0.50    Years: 4.00    Total pack years: 2.00    Types: Cigarettes  Last attempt to quit: 03/16/2016    Years since quitting: 6.2   Smokeless tobacco: Never  Vaping Use   Vaping Use: Never used  Substance Use Topics   Alcohol use: No   Drug use: No     Allergies   Shellfish allergy   Review of Systems Review of Systems  Gastrointestinal:  Positive for abdominal pain.     Physical Exam Triage Vital Signs ED Triage Vitals  Enc Vitals Group     BP 06/29/22 1241 (!) 167/115     Pulse Rate 06/29/22 1241 71     Resp 06/29/22 1241 20     Temp 06/29/22 1241 98.5 F (36.9 C)     Temp Source 06/29/22 1241 Oral     SpO2 06/29/22 1241 94 %     Weight --      Height --       Head Circumference --      Peak Flow --      Pain Score 06/29/22 1242 0     Pain Loc --      Pain Edu? --      Excl. in [place]? --    No data found.  Updated Vital Signs BP (!) 167/115 Comment: states ran out of HTN meds approx 1 month ago  Pulse 71   Temp 98.5 F (36.9 C) (Oral)   Resp 20   SpO2 94%   Visual Acuity Right Eye Distance:   Left Eye Distance:   Bilateral Distance:    Right Eye Near:   Left Eye Near:    Bilateral Near:     Physical Exam Constitutional:      Appearance: He is well-developed.  HENT:     Right Ear: Tympanic membrane and ear canal normal.     Left Ear: Tympanic membrane and ear canal normal.     Mouth/Throat:     Mouth: Mucous membranes are moist.     Pharynx: Oropharynx is clear.  Cardiovascular:     Rate and Rhythm: Normal rate and regular rhythm.     Heart sounds: Normal heart sounds.  Pulmonary:     Effort: Pulmonary effort is normal.     Breath sounds: Normal breath sounds and air entry. No wheezing, rhonchi or rales.  Abdominal:     General: Abdomen is flat. Bowel sounds are normal.     Palpations: Abdomen is soft.     Tenderness: There is generalized abdominal tenderness. There is no guarding or rebound.  Lymphadenopathy:     Cervical: No cervical adenopathy.  Neurological:     Mental Status: He is alert.      UC Treatments / Results  Labs (all labs ordered are listed, but only abnormal results are displayed) Labs Reviewed - No data to display  EKG   Radiology No results found.  Procedures Procedures (including critical care time)  Medications Ordered in UC Medications  ondansetron (ZOFRAN-ODT) disintegrating tablet 4 mg (4 mg Oral Given 06/29/22 1247)    Initial Impression / Assessment and Plan / UC Course  I have reviewed the triage vital signs and the nursing notes.  Pertinent labs & imaging results that were available during my care of the patient were reviewed by me and considered in my medical decision  making (see chart for details).    Plan: The diagnosis will be treated with the following: 1.  Nausea vomiting and diarrhea: A.  Zofran 4 mg every 6 hours as needed for symptoms. 2.  Gastroenteritis: A.  Zofran 4 mg every 6 hours as needed for symptoms. 3.  Advised bland diet along with clear liquids over the next 48 hours. 4.  Advised follow-up PCP or return to urgent care as needed. Final Clinical Impressions(s) / UC Diagnoses   Final diagnoses:  Nausea vomiting and diarrhea  Noninfectious gastroenteritis, unspecified type     Discharge Instructions      Advised take Zofran 4 mg every 6 hours on a regular basis for the next couple days until symptoms resolved. Advised to follow bland diet and avoid fried, spicy, greasy foods for the next couple days. Advised to make sure that she drink clear liquids over the next couple days and avoid dark-colored drinks.  Advised follow-up PCP or return to urgent care as needed.    ED Prescriptions     Medication Sig Dispense Auth. Provider   ondansetron (ZOFRAN) 4 MG tablet Take 1 tablet (4 mg total) by mouth every 8 (eight) hours as needed for nausea or vomiting. 20 tablet Nyoka Lint, PA-C      PDMP not reviewed this encounter.   Nyoka Lint, PA-C 06/29/22 1326

## 2022-06-29 NOTE — ED Triage Notes (Signed)
C/O waking up at 0200 with vomiting and intermittent pain across mid-abdomen. Denies diarrhea or fevers. States continued vomiting when leaving to go to work today.

## 2022-07-02 ENCOUNTER — Ambulatory Visit (HOSPITAL_COMMUNITY)
Admission: EM | Admit: 2022-07-02 | Discharge: 2022-07-02 | Disposition: A | Discharge status: Home / Self Care | Payer: Managed Care, Other (non HMO)

## 2022-07-02 ENCOUNTER — Encounter (HOSPITAL_COMMUNITY): Payer: Self-pay

## 2022-07-02 DIAGNOSIS — K529 Noninfective gastroenteritis and colitis, unspecified: Secondary | ICD-10-CM

## 2022-07-02 NOTE — ED Provider Notes (Signed)
[address]   VI:[id number] 07/02/22 Arrival Time: 0801  ASSESSMENT & PLAN:  1. Gastroenteritis    -History and exam is consistent with gastroenteritis.  I recommended he continue to take Zofran for symptomatic relief.  I advised slowly advancing diet as tolerated.  Advised to return for evaluation if he stops passing gas or having bowel movements.  Work note given.  All questions asked and agrees to plan.  No orders of the defined types were placed in this encounter.    Discharge Instructions      Stay hydrated with lots of fluids! Seek evaluation if you have worsening abdominal pain and not having bowel movements/passing gas!        Reviewed expectations re: course of current medical issues. Questions answered. Outlined signs and symptoms indicating need for more acute intervention. Patient verbalized understanding. After Visit Summary given.   SUBJECTIVE: Very pleasant 52 year old male comes to urgent care to be evaluated for vomiting.  He was here 3 days ago for the same.  He ate some bad old [place] food over the weekend which caused some severe vomiting.  He was treated symptomatically with Zofran.  He took that once but has not taken it longitudinally.  He ate light crackers and drank fluids over the last several days but last night tried to eat [place] Mike's.  After few bites he became violently sick again.  He really has not been able to eat much of anything over the last 24 hours.  He is here today to discuss return to work.  He works at a [employer], contracting and does not feel like he has the energy/ability to safely perform his job while sick.  Last bowel meant was last night.  No LMP for male patient. Past Surgical History:  Procedure Laterality Date   ACHILLES TENDON REPAIR       OBJECTIVE:  Vitals:   07/02/22 0830 07/02/22 0831  BP:  (!) 183/112  Pulse:  75  Resp:  16  Temp:  98.2 F (36.8 C)  TempSrc:  Oral  SpO2:  94%  Weight: 106.6 kg    Height: 5' 11"$  (1.803 m)      Physical Exam Vitals and nursing note reviewed.  Constitutional:      General: He is not in acute distress.    Appearance: Normal appearance. He is not ill-appearing.  HENT:     Head: Normocephalic.  Cardiovascular:     Rate and Rhythm: Normal rate.     Heart sounds: Normal heart sounds.  Pulmonary:     Effort: Pulmonary effort is normal.     Breath sounds: Normal breath sounds.  Abdominal:     General: Abdomen is flat. Bowel sounds are normal.     Palpations: Abdomen is soft.     Tenderness: There is abdominal tenderness (mid epigastric). There is no guarding or rebound.  Musculoskeletal:        General: Normal range of motion.     Left lower leg: No edema.  Skin:    General: Skin is warm.  Neurological:     General: No focal deficit present.     Mental Status: He is alert.  Psychiatric:        Mood and Affect: Mood normal.      Labs: Results for orders placed or performed during the hospital encounter of 01/11/18  CBC  Result Value Ref Range   WBC 14.9 (H) 4.0 - 10.5 K/uL   RBC 5.33 4.22 - 5.81 MIL/uL  Hemoglobin 15.4 13.0 - 17.0 g/dL   HCT 48.6 39.0 - 52.0 %   MCV 91.2 78.0 - 100.0 fL   MCH 28.9 26.0 - 34.0 pg   MCHC 31.7 30.0 - 36.0 g/dL   RDW 14.6 11.5 - 15.5 %   Platelets 227 150 - 400 K/uL  Comprehensive metabolic panel  Result Value Ref Range   Sodium 142 135 - 145 mmol/L   Potassium 3.8 3.5 - 5.1 mmol/L   Chloride 109 98 - 111 mmol/L   CO2 24 22 - 32 mmol/L   Glucose, Bld 106 (H) 70 - 99 mg/dL   BUN 10 6 - 20 mg/dL   Creatinine, Ser 1.09 0.61 - 1.24 mg/dL   Calcium 9.1 8.9 - 10.3 mg/dL   Total Protein 7.5 6.5 - 8.1 g/dL   Albumin 4.3 3.5 - 5.0 g/dL   AST 24 15 - 41 U/L   ALT 25 0 - 44 U/L   Alkaline Phosphatase 67 38 - 126 U/L   Total Bilirubin 1.6 (H) 0.3 - 1.2 mg/dL   GFR calc non Af Amer >60 >60 mL/min   GFR calc Af Amer >60 >60 mL/min   Anion gap 9 5 - 15  I-stat troponin, ED  Result Value Ref Range    Troponin i, poc 0.03 0.00 - 0.08 ng/mL   Comment 3           Labs Reviewed - No data to display  Imaging: No results found.   Allergies  Allergen Reactions   Shellfish Allergy Swelling                                               Past Medical History:  Diagnosis Date   Asthma    Hypertension     Social History   Socioeconomic History   Marital status: Married    Spouse name: Not on file   Number of children: Not on file   Years of education: Not on file   Highest education level: Not on file  Occupational History   Not on file  Tobacco Use   Smoking status: Every Day    Packs/day: 0.50    Years: 4.00    Total pack years: 2.00    Types: Cigarettes    Last attempt to quit: 03/16/2016    Years since quitting: 6.2   Smokeless tobacco: Never  Vaping Use   Vaping Use: Never used  Substance and Sexual Activity   Alcohol use: No   Drug use: No   Sexual activity: Not on file  Other Topics Concern   Not on file  Social History Narrative   Not on file   Social Determinants of Health   Financial Resource Strain: Not on file  Food Insecurity: Not on file  Transportation Needs: Not on file  Physical Activity: Not on file  Stress: Not on file  Social Connections: Not on file  Intimate Partner Violence: Not on file    History reviewed. No pertinent family history.    Dalayza Zambrana, Dorian Pod, MD 07/02/22 [phone]

## 2022-07-02 NOTE — Discharge Instructions (Addendum)
Stay hydrated with lots of fluids! Seek evaluation if you have worsening abdominal pain and not having bowel movements/passing gas!

## 2022-07-02 NOTE — ED Triage Notes (Signed)
Chief Complaint: Patient was seen Sunday for food poisoning. States did not eat for about 2 days. Ate [place] mikes yesterday and started to vomit again.  Patient has been nauseous since.   Onset: worse yesterday, ongoing since Saturday   Prescriptions or OTC medications tried: Yes- Zofran     with mild relief  Sick exposure: No  New foods, medications, or products: Yes- chinese food that was left over from Wednesday    Recent Travel: No

## 2023-01-24 ENCOUNTER — Encounter (HOSPITAL_COMMUNITY): Payer: Self-pay

## 2023-01-24 ENCOUNTER — Ambulatory Visit (HOSPITAL_COMMUNITY)
Admission: EM | Admit: 2023-01-24 | Discharge: 2023-01-24 | Disposition: A | Discharge status: Home / Self Care | Payer: Managed Care, Other (non HMO) | Attending: Internal Medicine | Admitting: Internal Medicine

## 2023-01-24 DIAGNOSIS — L03116 Cellulitis of left lower limb: Secondary | ICD-10-CM

## 2023-01-24 DIAGNOSIS — Z23 Encounter for immunization: Secondary | ICD-10-CM | POA: Diagnosis not present

## 2023-01-24 DIAGNOSIS — T25522A Corrosion of first degree of left foot, initial encounter: Secondary | ICD-10-CM

## 2023-01-24 MED ORDER — TETANUS-DIPHTH-ACELL PERTUSSIS 5-2.5-18.5 LF-MCG/0.5 IM SUSY
0.5000 mL | PREFILLED_SYRINGE | Freq: Once | INTRAMUSCULAR | Status: AC
  Administered 2023-01-24: 0.5 mL via INTRAMUSCULAR

## 2023-01-24 MED ORDER — SILVER SULFADIAZINE 1 % EX CREA
TOPICAL_CREAM | CUTANEOUS | Status: AC
  Filled 2023-01-24: qty 85

## 2023-01-24 MED ORDER — SILVER SULFADIAZINE 1 % EX CREA: 1.0000 | TOPICAL_CREAM | Freq: Two times a day (BID) | CUTANEOUS | 0 refills | Status: DC

## 2023-01-24 MED ORDER — TETANUS-DIPHTH-ACELL PERTUSSIS 5-2.5-18.5 LF-MCG/0.5 IM SUSY
PREFILLED_SYRINGE | INTRAMUSCULAR | Status: AC
  Filled 2023-01-24: qty 0.5

## 2023-01-24 MED ORDER — CEPHALEXIN 500 MG PO CAPS: 500.0000 mg | ORAL_CAPSULE | Freq: Three times a day (TID) | ORAL | 0 refills | Status: AC

## 2023-01-24 MED ORDER — SILVER SULFADIAZINE 1 % EX CREA: TOPICAL_CREAM | Freq: Once | CUTANEOUS | Status: AC
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
## 2023-01-24 NOTE — ED Provider Notes (Signed)
[facility]    CSN: [id number] Arrival date & time: 01/24/23  1001      History   Chief Complaint Chief Complaint  Patient presents with   Foot Burn    HPI Angel Martin is a 52 y.o. male.   Angel Martin is a 52 y.o. male presenting for chief complaint of foot burn.  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  He was able to get away from the water and to a dry area but states his feet felt like they were burning and very hot.  He is unsure if the water was mixed with any chemical product.  He went home, took off his shoes, rinsed his feet thoroughly in the shower, and went to bed.  He woke up the next morning with a large blister to the medial aspect of the left midfoot.  His wife popped the blister a couple of days ago resulting in copious clear drainage.  Over the last few days, the blister has become more red, swollen, warm to touch, and tender.  No numbness or tingling distally to injury/blister, lesions to the right foot, or history of immunosuppression/diabetes.  No recent antibiotic/steroid use.  Using over-the-counter Tylenol as needed for pain with some relief.  Unsure of date of last tetanus injection, believes it to be greater than 5 years ago.     Past Medical History:  Diagnosis Date   Asthma    Hypertension     Patient Active Problem List   Diagnosis Date Noted   Asthma 10/30/2015   Smoker 10/30/2015    Past Surgical History:  Procedure Laterality Date   ACHILLES TENDON REPAIR         Home Medications    Prior to Admission medications   Medication Sig Start Date End Date Taking? Authorizing Provider  albuterol (PROVENTIL HFA;VENTOLIN HFA) 108 (90 Base) MCG/ACT inhaler Inhale 1 puff into the lungs every 6 (six) hours as needed. For wheezing 10/30/15  Yes Tonye Pearson, MD  amLODipine (NORVASC) 5 MG  tablet Take 1 tablet by mouth daily. 08/17/20  Yes [provider]  cephALEXin (KEFLEX) 500 MG capsule Take 1 capsule (500 mg total) by mouth 3 (three) times daily for 5 days. 01/24/23 01/29/23 Yes StanhopeDonavan Burnet, FNP  lisinopril-hydrochlorothiazide (ZESTORETIC) 20-12.5 MG tablet Take 1 tablet by mouth daily. 08/17/20  Yes [provider]  montelukast (SINGULAIR) 10 MG tablet SMARTSIG:1 Tablet(s) By Mouth Every Evening PRN 03/19/20  Yes [provider]  silver sulfADIAZINE (SILVADENE) 1 % cream Apply 1 Application topically 2 (two) times daily. 01/24/23  Yes Carlisle Beers, FNP  fluticasone furoate-vilanterol (BREO ELLIPTA) 100-25 MCG/INH AEPB 1 puff 11/30/18   [provider]  ondansetron (ZOFRAN) 4 MG tablet Take 1 tablet (4 mg total) by mouth every 8 (eight) hours as needed for nausea or vomiting. 06/29/22   Ellsworth Lennox, PA-C  predniSONE (STERAPRED UNI-PAK 21 TAB) 10 MG (21) TBPK tablet 6 tabs for 1 day, then 5 tabs for 1 das, then 4 tabs for 1 day, then 3 tabs for 1 day, 2 tabs for 1 day, then 1 tab for 1 day Patient not taking: Reported on 10/25/2020 11/15/19   Janace Aris, NP  tobramycin-dexamethasone (TOBRADEX) ophthalmic solution Place 2 drops into the left eye every 4 (four) hours while awake.  Patient not taking: Reported on 10/25/2020 04/17/20   Linus Mako B, NP  beclomethasone (QVAR) 40 MCG/ACT inhaler Inhale 1 puff into the lungs 2 (two) times daily. Patient not taking: Reported on 01/11/2018 04/14/16 06/24/19  Magdalene River, PA-C    Family History History reviewed. No pertinent family history.  Social History Social History   Tobacco Use   Smoking status: Every Day    Current packs/day: 0.00    Average packs/day: 0.5 packs/day for 4.0 years (2.0 ttl pk-yrs)    Types: Cigarettes    Start date: 03/16/2012    Last attempt to quit: 03/16/2016    Years since quitting: 6.8   Smokeless tobacco: Never  Vaping Use   Vaping status: Never  Used  Substance Use Topics   Alcohol use: No   Drug use: No     Allergies   Shellfish allergy   Review of Systems Review of Systems Per HPI  Physical Exam Triage Vital Signs ED Triage Vitals  Encounter Vitals Group     BP 01/24/23 1015 126/85     Systolic BP Percentile --      Diastolic BP Percentile --      Pulse Rate 01/24/23 1015 82     Resp 01/24/23 1015 18     Temp 01/24/23 1015 98.6 F (37 C)     Temp Source 01/24/23 1015 Oral     SpO2 01/24/23 1015 93 %     Weight 01/24/23 1015 245 lb (111.1 kg)     Height 01/24/23 1015 5\' 11"  (1.803 m)     Head Circumference --      Peak Flow --      Pain Score 01/24/23 1012 6     Pain Loc --      Pain Education --      Exclude from Growth Chart --    No data found.  Updated Vital Signs BP 126/85 (BP Location: Left Arm)   Pulse 82   Temp 98.6 F (37 C) (Oral)   Resp 18   Ht 5\' 11"  (1.803 m)   Wt 245 lb (111.1 kg)   SpO2 93%   BMI 34.17 kg/m   Visual Acuity Right Eye Distance:   Left Eye Distance:   Bilateral Distance:    Right Eye Near:   Left Eye Near:    Bilateral Near:     Physical Exam Vitals and nursing note reviewed.  Constitutional:      Appearance: He is not ill-appearing or toxic-appearing.  HENT:     Head: Normocephalic and atraumatic.     Right Ear: Hearing and external ear normal.     Left Ear: Hearing and external ear normal.     Nose: Nose normal.     Mouth/Throat:     Lips: Pink.  Eyes:     General: Lids are normal. Vision grossly intact. Gaze aligned appropriately.     Extraocular Movements: Extraocular movements intact.     Conjunctiva/sclera: Conjunctivae normal.  Pulmonary:     Effort: Pulmonary effort is normal.  Musculoskeletal:     Cervical back: Neck supple.       Feet:  Feet:     Comments: Normal range of motion of the left foot, ambulatory with steady gait without difficulty.  Drainage from the vesicular lesion is clear, no purulence.  Slight warmth present to  surrounding lesion as well as erythema.  5/5 strength against resistance with dorsiflexion and plantarflexion of the bilateral lower extremities, sensation intact distally.  +  2 bilateral dorsalis pedis pulses with less than 2 capillary refill distally. Skin:    General: Skin is warm and dry.     Capillary Refill: Capillary refill takes less than 2 seconds.     Findings: No rash.  Neurological:     General: No focal deficit present.     Mental Status: He is alert and oriented to person, place, and time. Mental status is at baseline.     Cranial Nerves: No dysarthria or facial asymmetry.  Psychiatric:        Mood and Affect: Mood normal.        Speech: Speech normal.        Behavior: Behavior normal.        Thought Content: Thought content normal.        Judgment: Judgment normal.      UC Treatments / Results  Labs (all labs ordered are listed, but only abnormal results are displayed) Labs Reviewed - No data to display  EKG   Radiology No results found.  Procedures Procedures (including critical care time)  Medications Ordered in UC Medications  Tdap (BOOSTRIX) injection 0.5 mL (has no administration in time range)  silver sulfADIAZINE (SILVADENE) 1 % cream (has no administration in time range)    Initial Impression / Assessment and Plan / UC Course  I have reviewed the triage vital signs and the nursing notes.  Pertinent labs & imaging results that were available during my care of the patient were reviewed by me and considered in my medical decision making (see chart for details).   1.  Superficial chemical burn of left foot, cellulitis of left foot Wound cleansed and dressed in clinic with Silvadene cream, nonstick dressing, and Coban wrap.  Discussed wound care and use of Silvadene cream at home twice daily for the next 7 to 10 days.  Keflex antibiotic 3 times daily for 5 days to treat cellulitis associated with chemical burn.  Work excuse note given.  Advised to avoid  wearing tight fitting shoes for the next 3 to 5 days as wound heals.  Tdap updated.  Advise follow-up with PCP as needed and infection return precautions discussed.   Counseled patient on potential for adverse effects with medications prescribed/recommended today, strict ER and return-to-clinic precautions discussed, patient verbalized understanding.    Final Clinical Impressions(s) / UC Diagnoses   Final diagnoses:  Superficial chemical burn of left foot, initial encounter  Cellulitis of left foot     Discharge Instructions      You have a burn to the left foot, this appears to be mildly infected.  Avoid wearing tight shoes for the next 3 days as this may cause worsening infection.  Rinse the burn thoroughly at least 2 times a day with gentle soap and water. Apply silvadene cream to the area twice a day (morning and night) and change the dressing.  Take keflex antibiotic every 8 hours for the next 5 days.  Follow-up with PCP as needed. Return or seek medical attention if you notice worsening redness, swelling, warmth, pain, or pus drainage or fever/chills. Feel better!     ED Prescriptions     Medication Sig Dispense Auth. Provider   silver sulfADIAZINE (SILVADENE) 1 % cream Apply 1 Application topically 2 (two) times daily. 50 g Reita May M, FNP   cephALEXin (KEFLEX) 500 MG capsule Take 1 capsule (500 mg total) by mouth 3 (three) times daily for 5 days. 15 capsule Carlisle Beers, FNP  PDMP not reviewed this encounter.   Carlisle Beers, [place] 01/24/23 1045

## 2023-06-17 ENCOUNTER — Ambulatory Visit (INDEPENDENT_AMBULATORY_CARE_PROVIDER_SITE_OTHER): Payer: Managed Care, Other (non HMO)

## 2023-06-17 ENCOUNTER — Encounter (HOSPITAL_COMMUNITY): Payer: Self-pay | Admitting: Emergency Medicine

## 2023-06-17 ENCOUNTER — Ambulatory Visit (HOSPITAL_COMMUNITY)
Admission: EM | Admit: 2023-06-17 | Discharge: 2023-06-17 | Disposition: A | Discharge status: Home / Self Care | Payer: Managed Care, Other (non HMO) | Attending: Emergency Medicine | Admitting: Emergency Medicine

## 2023-06-17 DIAGNOSIS — J4541 Moderate persistent asthma with (acute) exacerbation: Secondary | ICD-10-CM | POA: Diagnosis not present

## 2023-06-17 DIAGNOSIS — R0602 Shortness of breath: Secondary | ICD-10-CM

## 2023-06-17 LAB — POCT INFLUENZA A/B
Influenza A, POC: NEGATIVE
Influenza B, POC: NEGATIVE

## 2023-06-17 MED ORDER — IPRATROPIUM-ALBUTEROL 0.5-2.5 (3) MG/3ML IN SOLN
RESPIRATORY_TRACT | Status: AC
  Filled 2023-06-17: qty 3

## 2023-06-17 MED ORDER — IPRATROPIUM-ALBUTEROL 0.5-2.5 (3) MG/3ML IN SOLN
3.0000 mL | Freq: Once | RESPIRATORY_TRACT | Status: AC
  Administered 2023-06-17: 3 mL via RESPIRATORY_TRACT

## 2023-06-17 MED ORDER — METHYLPREDNISOLONE SODIUM SUCC 125 MG IJ SOLR
INTRAMUSCULAR | Status: AC
  Filled 2023-06-17: qty 2

## 2023-06-17 MED ORDER — PREDNISONE 20 MG PO TABS: 40.0000 mg | ORAL_TABLET | Freq: Every day | ORAL | 0 refills | Status: AC

## 2023-06-17 MED ORDER — METHYLPREDNISOLONE SODIUM SUCC 125 MG IJ SOLR
125.0000 mg | Freq: Once | INTRAMUSCULAR | Status: AC
  Administered 2023-06-17: 125 mg via INTRAMUSCULAR

## 2023-06-17 MED ORDER — ALBUTEROL SULFATE HFA 108 (90 BASE) MCG/ACT IN AERS
1.0000 | INHALATION_SPRAY | Freq: Four times a day (QID) | RESPIRATORY_TRACT | 0 refills | Status: DC | PRN
Start: 2023-06-17 — End: 2023-08-24

## 2023-06-17 MED ORDER — ALBUTEROL SULFATE (2.5 MG/3ML) 0.083% IN NEBU: 2.5000 mg | INHALATION_SOLUTION | Freq: Four times a day (QID) | RESPIRATORY_TRACT | 12 refills | Status: DC | PRN

## 2023-06-17 MED ORDER — AZITHROMYCIN 250 MG PO TABS: ORAL_TABLET | ORAL | 0 refills | Status: AC

## 2023-06-17 NOTE — ED Provider Notes (Signed)
 [facility]    CSN: [id number] Arrival date & time: 06/17/23  1455      History   Chief Complaint Chief Complaint  Patient presents with   Shortness of Breath    HPI Angel Martin is a 53 y.o. male.   Patient presents with cough, congestion, and shortness of breath that began on Monday.  History of asthma.  Patient states he has been overusing his albuterol  inhaler with minimal relief.  Denies chest pain, known fever, fatigue, abdominal pain, vomiting, and diarrhea.   Shortness of Breath Associated symptoms: cough and wheezing   Associated symptoms: no abdominal pain, no chest pain, no fever and no vomiting     Past Medical History:  Diagnosis Date   Asthma    Hypertension     Patient Active Problem List   Diagnosis Date Noted   Asthma 10/30/2015   Smoker 10/30/2015    Past Surgical History:  Procedure Laterality Date   ACHILLES TENDON REPAIR         Home Medications    Prior to Admission medications   Medication Sig Start Date End Date Taking? Authorizing Provider  albuterol  (PROVENTIL  HFA;VENTOLIN  HFA) 108 (90 Base) MCG/ACT inhaler Inhale 1 puff into the lungs every 6 (six) hours as needed. For wheezing 10/30/15  Yes Joylene Lamar SQUIBB, MD  albuterol  (PROVENTIL ) (2.5 MG/3ML) 0.083% nebulizer solution Take 3 mLs (2.5 mg total) by nebulization every 6 (six) hours as needed for wheezing or shortness of breath. 06/17/23  Yes [name], NP  albuterol  (VENTOLIN  HFA) 108 (90 Base) MCG/ACT inhaler Inhale 1-2 puffs into the lungs every 6 (six) hours as needed for wheezing or shortness of breath. 06/17/23  Yes [name], [name], NP  amLODipine (NORVASC) 5 MG tablet Take 1 tablet by mouth daily. 08/17/20  Yes [provider]  azithromycin  (ZITHROMAX  Z-PAK) 250 MG tablet Take 2 pills (500mg ) first day and one pill (250mg ) the remaining 4 days. 06/17/23  Yes [name], [name], NP  fluticasone furoate-vilanterol (BREO ELLIPTA) 100-25 MCG/INH AEPB 1 puff  11/30/18  Yes [provider]  montelukast (SINGULAIR) 10 MG tablet SMARTSIG:1 Tablet(s) By Mouth Every Evening PRN 03/19/20  Yes [provider]  predniSONE  (DELTASONE ) 20 MG tablet Take 2 tablets (40 mg total) by mouth daily for 5 days. 06/17/23 06/22/23 Yes [name], NP  lisinopril-hydrochlorothiazide (ZESTORETIC) 20-12.5 MG tablet Take 1 tablet by mouth daily. 08/17/20   [provider]  ondansetron  (ZOFRAN ) 4 MG tablet Take 1 tablet (4 mg total) by mouth every 8 (eight) hours as needed for nausea or vomiting. 06/29/22   Lynwood Lenis, PA-C  silver  sulfADIAZINE  (SILVADENE ) 1 % cream Apply 1 Application topically 2 (two) times daily. 01/24/23   Enedelia Dorna HERO, FNP  tobramycin -dexamethasone  (TOBRADEX ) ophthalmic solution Place 2 drops into the left eye every 4 (four) hours while awake. Patient not taking: Reported on 10/25/2020 04/17/20   Burky, Natalie B, NP  beclomethasone (QVAR ) 40 MCG/ACT inhaler Inhale 1 puff into the lungs 2 (two) times daily. Patient not taking: Reported on 01/11/2018 04/14/16 06/24/19  Starla Laymon BIRCH, PA-C    Family History History reviewed. No pertinent family history.  Social History Social History   Tobacco Use   Smoking status: Every Day    Current packs/day: 0.00    Average packs/day: 0.5 packs/day for 4.0 years (2.0 ttl pk-yrs)    Types: Cigarettes    Start date: 03/16/2012    Last attempt to quit: 03/16/2016  Years since quitting: 7.2   Smokeless tobacco: Never  Vaping Use   Vaping status: Never Used  Substance Use Topics   Alcohol use: No   Drug use: No     Allergies   Shellfish allergy   Review of Systems Review of Systems  Constitutional:  Positive for chills. Negative for fatigue and fever.  HENT:  Positive for congestion and rhinorrhea.   Respiratory:  Positive for cough, chest tightness, shortness of breath and wheezing.   Cardiovascular:  Negative for chest pain.  Gastrointestinal:  Negative for  abdominal pain, diarrhea, nausea and vomiting.  Neurological:  Negative for weakness.     Physical Exam Triage Vital Signs ED Triage Vitals  Encounter Vitals Group     BP 06/17/23 1547 123/74     Systolic BP Percentile --      Diastolic BP Percentile --      Pulse Rate 06/17/23 1547 (!) 109     Resp 06/17/23 1547 (!) 24     Temp 06/17/23 1547 99.1 F (37.3 C)     Temp Source 06/17/23 1547 Oral     SpO2 06/17/23 1547 (!) 86 %     Weight --      Height --      Head Circumference --      Peak Flow --      Pain Score 06/17/23 1549 0     Pain Loc --      Pain Education --      Exclude from Growth Chart --    No data found.  Updated Vital Signs BP 123/74 (BP Location: Left Arm)   Pulse (!) 109   Temp 99.1 F (37.3 C) (Oral)   Resp (!) 24   SpO2 (!) 86%   Visual Acuity Right Eye Distance:   Left Eye Distance:   Bilateral Distance:    Right Eye Near:   Left Eye Near:    Bilateral Near:     Physical Exam Vitals and nursing note reviewed.  Constitutional:      General: He is awake. He is not in acute distress.    Appearance: Normal appearance. He is well-developed and well-groomed. He is not ill-appearing.  HENT:     Right Ear: Tympanic membrane, ear canal and external ear normal.     Left Ear: Tympanic membrane, ear canal and external ear normal.     Nose: Congestion and rhinorrhea present.     Mouth/Throat:     Mouth: Mucous membranes are moist.     Pharynx: Posterior oropharyngeal erythema present. No oropharyngeal exudate.  Cardiovascular:     Rate and Rhythm: Normal rate and regular rhythm.  Pulmonary:     Effort: Tachypnea present.     Breath sounds: Decreased air movement present. Wheezing present.  Musculoskeletal:        General: Normal range of motion.     Cervical back: Normal range of motion and neck supple.  Skin:    General: Skin is warm and dry.  Neurological:     Mental Status: He is alert.  Psychiatric:        Behavior: Behavior is  cooperative.      UC Treatments / Results  Labs (all labs ordered are listed, but only abnormal results are displayed) Labs Reviewed  POCT INFLUENZA A/B - Normal    EKG   Radiology DG Chest 2 View Result Date: 06/17/2023 CLINICAL DATA:  Shortness of breath EXAM: CHEST - 2 VIEW COMPARISON:  01/11/2018 FINDINGS: Mild  reticulonodular infiltrate suspected at the left lung base. Normal cardiac size. No pneumothorax IMPRESSION: Mild reticulonodular infiltrate suspected at the left lung base. Electronically Signed   By: Luke Bun M.D.   On: 06/17/2023 18:11    Procedures Procedures (including critical care time)  Medications Ordered in UC Medications  ipratropium-albuterol  (DUONEB) 0.5-2.5 (3) MG/3ML nebulizer solution 3 mL (3 mLs Nebulization Given 06/17/23 1607)  methylPREDNISolone  sodium succinate (SOLU-MEDROL ) 125 mg/2 mL injection 125 mg (125 mg Intramuscular Given 06/17/23 1607)  ipratropium-albuterol  (DUONEB) 0.5-2.5 (3) MG/3ML nebulizer solution 3 mL (3 mLs Nebulization Given 06/17/23 1633)    Initial Impression / Assessment and Plan / UC Course  I have reviewed the triage vital signs and the nursing notes.  Pertinent labs & imaging results that were available during my care of the patient were reviewed by me and considered in my medical decision making (see chart for details).     Patient presented with cough, congestion, and shortness of breath that began on Monday.  History of asthma.  Patient states he has been you over using his albuterol  inhaler with minimal relief.  Denies any other symptoms.  Upon assessment congestion and rhinorrhea are present, mild erythema noted to pharynx.  Patient is tachypneic.  Wheezing and decreased air movement noticed throughout all lung fields.  Initial SpO2 was 86% on room air.  Given 2 DuoNebs and Solu-Medrol  injection with relief of wheezing, but decreased air movement persists.  SpO2 increased to 88% on room air.  Recommended that  patient go to ER for further evaluation and treatment due to new hypoxia and decreased air movement.  Patient is adamantly refusing to go to the ER.  Discussed with patient why it is important that patient be seen in the ER and the risks of going home without further treatment.  Patient verbalized understanding and continued to refuse to go to the ER.  Chest x-ray ordered which revealed a suspected mild reticulonodular infiltrate at left lung base. Radiologist report resulted after patient left clinic. Called patient to inform him of X-ray result and recommended continuing current treatment plan and following up with Primary care provider about a possible repeat of X-ray or further imaging in a few weeks.   Prescribed azithromycin  for possible pneumonia coverage.  Prescribed prednisone  burst, albuterol  inhaler and nebulizers for asthma exacerbation.  Recommended patient follow-up with primary care provider if symptoms persist.  Continue to strongly recommend that patient be seen in the ER, with continued refusal.  Discussed strict ER precautions. Final Clinical Impressions(s) / UC Diagnoses   Final diagnoses:  Shortness of breath  Moderate persistent asthma with acute exacerbation     Discharge Instructions      Start taking azithromycin  by taking 2 tablets upon picking up prescription and 1 tablet for the 4 remaining days.  Start taking prednisone  once daily for 5 days.  I have prescribed another albuterol  inhaler for you.  I have also prescribed some albuterol  nebulizers and you are provided with a nebulizer machine today.  Use nebulizers every 6 hours as needed for chest tightness, shortness of breath, and wheezing.  You can also use your albuterol  inhaler every 6 hours as needed for mild wheezing and shortness of breath.  Return here as needed or follow-up with primary care provider.  If you develop worsening shortness of breath, trouble breathing, or chest pain please seek immediate medical  treatment in the ER.     ED Prescriptions     Medication Sig Dispense Auth. Provider  predniSONE  (DELTASONE ) 20 MG tablet Take 2 tablets (40 mg total) by mouth daily for 5 days. 10 tablet [name], NP   albuterol  (PROVENTIL ) (2.5 MG/3ML) 0.083% nebulizer solution Take 3 mLs (2.5 mg total) by nebulization every 6 (six) hours as needed for wheezing or shortness of breath. 75 mL [name], Erubiel Manasco A, NP   azithromycin  (ZITHROMAX  Z-PAK) 250 MG tablet Take 2 pills (500mg ) first day and one pill (250mg ) the remaining 4 days. 6 tablet [name], NP   albuterol  (VENTOLIN  HFA) 108 (90 Base) MCG/ACT inhaler Inhale 1-2 puffs into the lungs every 6 (six) hours as needed for wheezing or shortness of breath. 8.5 g [name], NP      PDMP not reviewed this encounter.   [name], NP 06/17/23 [phone]

## 2023-06-17 NOTE — Discharge Instructions (Signed)
 Start taking azithromycin  by taking 2 tablets upon picking up prescription and 1 tablet for the 4 remaining days.  Start taking prednisone  once daily for 5 days.  I have prescribed another albuterol  inhaler for you.  I have also prescribed some albuterol  nebulizers and you are provided with a nebulizer machine today.  Use nebulizers every 6 hours as needed for chest tightness, shortness of breath, and wheezing.  You can also use your albuterol  inhaler every 6 hours as needed for mild wheezing and shortness of breath.  Return here as needed or follow-up with primary care provider.  If you develop worsening shortness of breath, trouble breathing, or chest pain please seek immediate medical treatment in the ER.

## 2023-06-17 NOTE — ED Triage Notes (Signed)
 Patient c/o SOB, problems w/asthma has worsened since Sunday.  Patient does have a cough, difficulty sleeping.  Patient does have an Albuterol  Inhaler and Singulair.

## 2023-06-17 NOTE — ED Notes (Signed)
 Pt's O2 86% ra, provider notified and v/o's given.

## 2023-06-25 ENCOUNTER — Other Ambulatory Visit: Payer: Self-pay

## 2023-06-25 ENCOUNTER — Encounter (HOSPITAL_COMMUNITY): Payer: Self-pay | Admitting: *Deleted

## 2023-06-25 ENCOUNTER — Ambulatory Visit (HOSPITAL_COMMUNITY)
Admission: EM | Admit: 2023-06-25 | Discharge: 2023-06-25 | Disposition: A | Discharge status: Home / Self Care | Payer: Managed Care, Other (non HMO)

## 2023-06-25 DIAGNOSIS — J4520 Mild intermittent asthma, uncomplicated: Secondary | ICD-10-CM

## 2023-06-25 NOTE — ED Provider Notes (Signed)
[facility]    CSN: [id number] Arrival date & time: 06/25/23  1534      History   Chief Complaint Chief Complaint  Patient presents with   Asthma    HPI Angel Martin is a 53 y.o. male.  Here with chest tightness after a N95 fit test this morning. Symptoms resolved shortly after. He is not currently having tightness or shortness of breath.  Reports early next week has PCP appointment  Seen here last week for asthma exacerbation, had new oxygen requirement with SpO2 86% RA, and was highly encouraged and recommended to be seen in the emergency department.  However it seems patient refused several times to be seen in the ED. Chest xray done and UC provider covered for possible pneumonia with azithromycin. Also had prednisone burst. Cough is almost completely gone now and he is feeling much better.  Past Medical History:  Diagnosis Date   Asthma    Hypertension     Patient Active Problem List   Diagnosis Date Noted   Asthma 10/30/2015   Smoker 10/30/2015    Past Surgical History:  Procedure Laterality Date   ACHILLES TENDON REPAIR         Home Medications    Prior to Admission medications   Medication Sig Start Date End Date Taking? Authorizing Provider  albuterol (PROVENTIL HFA;VENTOLIN HFA) 108 (90 Base) MCG/ACT inhaler Inhale 1 puff into the lungs every 6 (six) hours as needed. For wheezing 10/30/15   Tonye Pearson, MD  albuterol (PROVENTIL) (2.5 MG/3ML) 0.083% nebulizer solution Take 3 mLs (2.5 mg total) by nebulization every 6 (six) hours as needed for wheezing or shortness of breath. 06/17/23   Wynonia Lawman A, NP  albuterol (VENTOLIN HFA) 108 (90 Base) MCG/ACT inhaler Inhale 1-2 puffs into the lungs every 6 (six) hours as needed for wheezing or shortness of breath. 06/17/23   Wynonia Lawman A, NP  amLODipine (NORVASC) 5 MG tablet Take 1 tablet by mouth daily. 08/17/20   [provider]  azithromycin (ZITHROMAX Z-PAK) 250 MG tablet Take 2  pills (500mg ) first day and one pill (250mg ) the remaining 4 days. 06/17/23   Wynonia Lawman A, NP  fluticasone furoate-vilanterol (BREO ELLIPTA) 100-25 MCG/INH AEPB 1 puff 11/30/18   [provider]  lisinopril-hydrochlorothiazide (ZESTORETIC) 20-12.5 MG tablet Take 1 tablet by mouth daily. 08/17/20   [provider]  montelukast (SINGULAIR) 10 MG tablet SMARTSIG:1 Tablet(s) By Mouth Every Evening PRN 03/19/20   [provider]  beclomethasone (QVAR) 40 MCG/ACT inhaler Inhale 1 puff into the lungs 2 (two) times daily. Patient not taking: Reported on 01/11/2018 04/14/16 06/24/19  Magdalene River, PA-C    Family History History reviewed. No pertinent family history.  Social History Social History   Tobacco Use   Smoking status: Every Day    Current packs/day: 0.00    Average packs/day: 0.5 packs/day for 4.0 years (2.0 ttl pk-yrs)    Types: Cigarettes    Start date: 03/16/2012    Last attempt to quit: 03/16/2016    Years since quitting: 7.2   Smokeless tobacco: Never  Vaping Use   Vaping status: Never Used  Substance Use Topics   Alcohol use: No   Drug use: No     Allergies   Shellfish allergy   Review of Systems Review of Systems Per HPI  Physical Exam Triage Vital Signs ED Triage Vitals [06/25/23 1736]  Encounter Vitals Group     BP  Systolic BP Percentile      Diastolic BP Percentile      Pulse      Resp      Temp      Temp src      SpO2      Weight      Height      Head Circumference      Peak Flow      Pain Score 0     Pain Loc      Pain Education      Exclude from Growth Chart    No data found.  Updated Vital Signs BP 138/86   Pulse 83   Temp 98.6 F (37 C)   Resp 20   SpO2 97%    Physical Exam Vitals and nursing note reviewed.  Constitutional:      General: He is not in acute distress.    Appearance: Normal appearance. He is not ill-appearing.  HENT:     Mouth/Throat:     Mouth: Mucous membranes are moist.      Pharynx: Oropharynx is clear.  Cardiovascular:     Rate and Rhythm: Normal rate and regular rhythm.     Heart sounds: Normal heart sounds.  Pulmonary:     Effort: Pulmonary effort is normal.     Breath sounds: Normal breath sounds.     Comments: Faint expiratory wheezing right lung Abdominal:     Palpations: Abdomen is soft.  Musculoskeletal:     Cervical back: Normal range of motion.  Neurological:     Mental Status: He is alert and oriented to person, place, and time.     UC Treatments / Results  Labs (all labs ordered are listed, but only abnormal results are displayed) Labs Reviewed - No data to display  EKG  Radiology No results found.  Procedures Procedures   Medications Ordered in UC Medications - No data to display  Initial Impression / Assessment and Plan / UC Course  I have reviewed the triage vital signs and the nursing notes.  Pertinent labs & imaging results that were available during my care of the patient were reviewed by me and considered in my medical decision making (see chart for details).  Sating 97% on room air which is big improvement from last week (86%) Feeling much improved after prednisone and azithromycin. Has not picked up albuterol inhaler yet Patient reports he would just like a note for work - provided.  Well appearing at this time Can return if needed PCP visit next week  Final Clinical Impressions(s) / UC Diagnoses   Final diagnoses:  Mild intermittent asthma without complication     Discharge Instructions      I recommend to use inhaler (or nebulizer) 2-3 times daily for the next several days. This will help to reduce wheezing! Otherwise I am glad you're feeling better. Please keep your follow-up appointment next week.     ED Prescriptions   None    PDMP not reviewed this encounter.   Marlow Baars, [place] 06/25/23 1818

## 2023-06-25 NOTE — ED Notes (Signed)
Pt has his nebulizer at home with meds but is not using it . Pt also did not pick up the rescue inhaler .

## 2023-06-25 NOTE — ED Triage Notes (Signed)
Pt reports he was feeling tight today after his breathing test. Pt has appt with his Dr. Next week. Pt is here today to get checked out.

## 2023-06-25 NOTE — Discharge Instructions (Signed)
I recommend to use inhaler (or nebulizer) 2-3 times daily for the next several days. This will help to reduce wheezing! Otherwise I am glad you're feeling better. Please keep your follow-up appointment next week.

## 2023-08-24 ENCOUNTER — Ambulatory Visit (HOSPITAL_COMMUNITY)
Admission: EM | Admit: 2023-08-24 | Discharge: 2023-08-24 | Disposition: A | Discharge status: Home / Self Care | Attending: Physician Assistant | Admitting: Physician Assistant

## 2023-08-24 ENCOUNTER — Encounter (HOSPITAL_COMMUNITY): Payer: Self-pay | Admitting: *Deleted

## 2023-08-24 ENCOUNTER — Other Ambulatory Visit: Payer: Self-pay

## 2023-08-24 DIAGNOSIS — J069 Acute upper respiratory infection, unspecified: Secondary | ICD-10-CM | POA: Diagnosis not present

## 2023-08-24 DIAGNOSIS — J4521 Mild intermittent asthma with (acute) exacerbation: Secondary | ICD-10-CM

## 2023-08-24 MED ORDER — BENZONATATE 100 MG PO CAPS
100.0000 mg | ORAL_CAPSULE | Freq: Three times a day (TID) | ORAL | 0 refills | Status: AC
Start: 2023-08-24 — End: ?

## 2023-08-24 MED ORDER — PREDNISONE 20 MG PO TABS
40.0000 mg | ORAL_TABLET | Freq: Every day | ORAL | 0 refills | Status: AC
Start: 2023-08-24 — End: 2023-08-29

## 2023-08-24 MED ORDER — ALBUTEROL SULFATE HFA 108 (90 BASE) MCG/ACT IN AERS
1.0000 | INHALATION_SPRAY | Freq: Four times a day (QID) | RESPIRATORY_TRACT | 0 refills | Status: AC | PRN
Start: 2023-08-24 — End: ?

## 2023-08-24 MED ORDER — ALBUTEROL SULFATE (2.5 MG/3ML) 0.083% IN NEBU
2.5000 mg | INHALATION_SOLUTION | Freq: Four times a day (QID) | RESPIRATORY_TRACT | 12 refills | Status: AC | PRN
Start: 2023-08-24 — End: ?

## 2023-08-24 NOTE — ED Provider Notes (Signed)
 [facility]    CSN: [id number] Arrival date & time: 08/24/23  1535      History   Chief Complaint Chief Complaint  Patient presents with   Nasal Congestion    HPI Angel Martin is a 53 y.o. male.   Pt reports cough, congestion that started one week ago.  He reports feeling somewhat better today.  He has a h/o asthma, had to use his breathing treatment which provided relief. Denies current shortness of breath and wheezing, reports improvement over the last few days.  He reports persistent cough. Reports cough is worse first thing in the morning, sometimes keeps him up at night.  He is currently taking nothing for the cough, but has tried benadryl at bedtime.     Past Medical History:  Diagnosis Date   Asthma    Hypertension     Patient Active Problem List   Diagnosis Date Noted   Asthma 10/30/2015   Smoker 10/30/2015    Past Surgical History:  Procedure Laterality Date   ACHILLES TENDON REPAIR         Home Medications    Prior to Admission medications   Medication Sig Start Date End Date Taking? Authorizing Provider  albuterol (PROVENTIL) (2.5 MG/3ML) 0.083% nebulizer solution Take 3 mLs (2.5 mg total) by nebulization every 6 (six) hours as needed for wheezing or shortness of breath. 08/24/23  Yes Ward, Tylene Fantasia, PA-C  albuterol (VENTOLIN HFA) 108 (90 Base) MCG/ACT inhaler Inhale 1-2 puffs into the lungs every 6 (six) hours as needed for wheezing or shortness of breath. 08/24/23  Yes Ward, Tylene Fantasia, PA-C  benzonatate (TESSALON) 100 MG capsule Take 1 capsule (100 mg total) by mouth every 8 (eight) hours. 08/24/23  Yes Ward, Tylene Fantasia, PA-C  predniSONE (DELTASONE) 20 MG tablet Take 2 tablets (40 mg total) by mouth daily for 5 days. 08/24/23 08/29/23 Yes Ward, Tylene Fantasia, PA-C  amLODipine (NORVASC) 5 MG tablet Take 1 tablet by mouth daily. 08/17/20   [provider]  azithromycin (ZITHROMAX Z-PAK) 250 MG tablet Take 2 pills (500mg ) first day and one pill  (250mg ) the remaining 4 days. 06/17/23   Wynonia Lawman A, NP  fluticasone furoate-vilanterol (BREO ELLIPTA) 100-25 MCG/INH AEPB 1 puff 11/30/18   [provider]  lisinopril-hydrochlorothiazide (ZESTORETIC) 20-12.5 MG tablet Take 1 tablet by mouth daily. 08/17/20   [provider]  montelukast (SINGULAIR) 10 MG tablet SMARTSIG:1 Tablet(s) By Mouth Every Evening PRN 03/19/20   [provider]  beclomethasone (QVAR) 40 MCG/ACT inhaler Inhale 1 puff into the lungs 2 (two) times daily. Patient not taking: Reported on 01/11/2018 04/14/16 06/24/19  Magdalene River, PA-C    Family History History reviewed. No pertinent family history.  Social History Social History   Tobacco Use   Smoking status: Every Day    Current packs/day: 0.00    Average packs/day: 0.5 packs/day for 4.0 years (2.0 ttl pk-yrs)    Types: Cigarettes    Start date: 03/16/2012    Last attempt to quit: 03/16/2016    Years since quitting: 7.4   Smokeless tobacco: Never  Vaping Use   Vaping status: Never Used  Substance Use Topics   Alcohol use: No   Drug use: No     Allergies   Shellfish allergy   Review of Systems Review of Systems  Constitutional:  Negative for chills and fever.  HENT:  Positive for congestion. Negative for ear pain and sore throat.   Eyes:  Negative for  pain and visual disturbance.  Respiratory:  Positive for cough. Negative for shortness of breath.   Cardiovascular:  Negative for chest pain and palpitations.  Gastrointestinal:  Negative for abdominal pain and vomiting.  Genitourinary:  Negative for dysuria and hematuria.  Musculoskeletal:  Negative for arthralgias and back pain.  Skin:  Negative for color change and rash.  Neurological:  Negative for seizures and syncope.  All other systems reviewed and are negative.    Physical Exam Triage Vital Signs ED Triage Vitals  Encounter Vitals Group     BP 08/24/23 1602 125/89     Systolic BP Percentile --       Diastolic BP Percentile --      Pulse Rate 08/24/23 1602 80     Resp 08/24/23 1602 20     Temp 08/24/23 1602 98.2 F (36.8 C)     Temp src --      SpO2 08/24/23 1602 90 %     Weight --      Height --      Head Circumference --      Peak Flow --      Pain Score 08/24/23 1601 0     Pain Loc --      Pain Education --      Exclude from Growth Chart --    No data found.  Updated Vital Signs BP 125/89   Pulse 80   Temp 98.2 F (36.8 C)   Resp 20   SpO2 90%   Visual Acuity Right Eye Distance:   Left Eye Distance:   Bilateral Distance:    Right Eye Near:   Left Eye Near:    Bilateral Near:     Physical Exam Vitals and nursing note reviewed.  Constitutional:      General: He is not in acute distress.    Appearance: He is well-developed.  HENT:     Head: Normocephalic and atraumatic.     Nose: Congestion present.  Eyes:     Conjunctiva/sclera: Conjunctivae normal.  Cardiovascular:     Rate and Rhythm: Normal rate and regular rhythm.     Heart sounds: No murmur heard. Pulmonary:     Effort: Pulmonary effort is normal. No respiratory distress.     Breath sounds: Normal breath sounds.  Abdominal:     Palpations: Abdomen is soft.     Tenderness: There is no abdominal tenderness.  Musculoskeletal:        General: No swelling.     Cervical back: Neck supple.  Skin:    General: Skin is warm and dry.     Capillary Refill: Capillary refill takes less than 2 seconds.  Neurological:     Mental Status: He is alert.  Psychiatric:        Mood and Affect: Mood normal.      UC Treatments / Results  Labs (all labs ordered are listed, but only abnormal results are displayed) Labs Reviewed - No data to display  EKG   Radiology No results found.  Procedures Procedures (including critical care time)  Medications Ordered in UC Medications - No data to display  Initial Impression / Assessment and Plan / UC Course  I have reviewed the triage vital signs and the  nursing notes.  Pertinent labs & imaging results that were available during my care of the patient were reviewed by me and considered in my medical decision making (see chart for details).     Upper respiratory infection.  Will treat  for mild acute asthma exacerbation with prednisone.  Refill of inhaler and nebulizer solution provided today.  No wheezing heard on exam, lungs clear to auscultation.  Supportive care discussed.  Tessalon prescribed.  Short course of prednisone prescribed as well.  Return precautions discussed.  Work note given. Final Clinical Impressions(s) / UC Diagnoses   Final diagnoses:  Mild intermittent asthma with acute exacerbation  URI, acute     Discharge Instructions      Take tessalon as needed for cough. Take prednisone as prescribed. Use inhaler or nebulizer as needed for wheezing or shortness of breath. Recommend daily allergy medication like Claritin or Allegra. Drink plenty of fluids, rest. If no improvement or symptoms become worse return for evaluation.     ED Prescriptions     Medication Sig Dispense Auth. Provider   benzonatate (TESSALON) 100 MG capsule Take 1 capsule (100 mg total) by mouth every 8 (eight) hours. 21 capsule Ward, Teyana Pierron Z, PA-C   albuterol (VENTOLIN HFA) 108 (90 Base) MCG/ACT inhaler Inhale 1-2 puffs into the lungs every 6 (six) hours as needed for wheezing or shortness of breath. 1 each Ward, Char Common, PA-C   albuterol (PROVENTIL) (2.5 MG/3ML) 0.083% nebulizer solution Take 3 mLs (2.5 mg total) by nebulization every 6 (six) hours as needed for wheezing or shortness of breath. 75 mL Ward, Camilo Cella Z, PA-C   predniSONE (DELTASONE) 20 MG tablet Take 2 tablets (40 mg total) by mouth daily for 5 days. 10 tablet Ward, Mariadelaluz Guggenheim Z, PA-C      PDMP not reviewed this encounter.   Ward, Char Common, PA-C 08/24/23 [phone]

## 2023-08-24 NOTE — ED Triage Notes (Signed)
 PT reports congestion in chest and nasal . P thas a cough . Pt reports Sx's are due to pollen.

## 2023-08-24 NOTE — Discharge Instructions (Signed)
 Take tessalon as needed for cough. Take prednisone as prescribed. Use inhaler or nebulizer as needed for wheezing or shortness of breath. Recommend daily allergy medication like Claritin or Allegra. Drink plenty of fluids, rest. If no improvement or symptoms become worse return for evaluation.
